# Patient Record
Sex: Male | Born: 1985 | Race: White | Hispanic: No | Marital: Single | Smoking: Current every day smoker
Health system: Southern US, Community
[De-identification: ages and names within clinical notes are randomized; demographics above are authoritative.]

## PROBLEM LIST (undated history)

## (undated) HISTORY — PX: OTHER SURGICAL HISTORY: SHX169

## (undated) HISTORY — PX: HERNIA REPAIR: SHX51

---
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
## 2007-01-18 ENCOUNTER — Emergency Department: Payer: Self-pay | Admitting: Emergency Medicine

## 2008-05-06 ENCOUNTER — Emergency Department: Payer: Self-pay | Admitting: Internal Medicine

## 2009-10-01 ENCOUNTER — Emergency Department: Payer: Self-pay | Admitting: Emergency Medicine

## 2010-07-23 ENCOUNTER — Emergency Department: Payer: Self-pay | Admitting: Emergency Medicine

## 2010-11-09 ENCOUNTER — Emergency Department: Payer: Self-pay | Admitting: Emergency Medicine

## 2011-02-01 ENCOUNTER — Emergency Department: Payer: Self-pay | Admitting: *Deleted

## 2011-05-30 ENCOUNTER — Emergency Department: Payer: Self-pay | Admitting: Internal Medicine

## 2011-11-09 ENCOUNTER — Emergency Department: Payer: Self-pay | Admitting: Emergency Medicine

## 2011-11-09 LAB — URINALYSIS, COMPLETE
Bacteria: NONE SEEN
Bilirubin,UR: NEGATIVE
Blood: NEGATIVE
Glucose,UR: NEGATIVE mg/dL (ref 0–75)
Protein: NEGATIVE
WBC UR: 2 /HPF (ref 0–5)

## 2011-11-09 LAB — COMPREHENSIVE METABOLIC PANEL
Anion Gap: 6 — ABNORMAL LOW (ref 7–16)
Bilirubin,Total: 0.5 mg/dL (ref 0.2–1.0)
Calcium, Total: 9 mg/dL (ref 8.5–10.1)
Co2: 26 mmol/L (ref 21–32)
Creatinine: 0.7 mg/dL (ref 0.60–1.30)
EGFR (African American): >60
EGFR (Non-African Amer.): >60
Glucose: 89 mg/dL (ref 65–99)
Potassium: 3.8 mmol/L (ref 3.5–5.1)
SGOT(AST): 16 U/L (ref 15–37)
Sodium: 138 mmol/L (ref 136–145)
Total Protein: 7.3 g/dL (ref 6.4–8.2)

## 2011-11-09 LAB — CBC
MCH: 30.2 pg (ref 26.0–34.0)
RDW: 14.3 % (ref 11.5–14.5)
WBC: 8 10*3/uL (ref 3.8–10.6)

## 2012-02-27 ENCOUNTER — Emergency Department: Payer: Self-pay | Admitting: Emergency Medicine

## 2012-02-27 LAB — CBC WITH DIFFERENTIAL/PLATELET
Eosinophil #: 0.3 10*3/uL (ref 0.0–0.7)
Eosinophil %: 4.6 %
HCT: 47.4 % (ref 40.0–52.0)
HGB: 16.5 g/dL (ref 13.0–18.0)
MCHC: 34.8 g/dL (ref 32.0–36.0)
MCV: 87 fL (ref 80–100)
Neutrophil #: 3.5 10*3/uL (ref 1.4–6.5)
Neutrophil %: 58.6 %
Platelet: 214 10*3/uL (ref 150–440)
RBC: 5.49 10*6/uL (ref 4.40–5.90)
RDW: 14 % (ref 11.5–14.5)
WBC: 6 10*3/uL (ref 3.8–10.6)

## 2012-02-27 LAB — BASIC METABOLIC PANEL
Anion Gap: 4 — ABNORMAL LOW (ref 7–16)
BUN: 9 mg/dL (ref 7–18)
Chloride: 106 mmol/L (ref 98–107)
Creatinine: 0.71 mg/dL (ref 0.60–1.30)
EGFR (African American): >60
EGFR (Non-African Amer.): >60
Potassium: 4 mmol/L (ref 3.5–5.1)

## 2012-10-18 ENCOUNTER — Emergency Department: Payer: Self-pay | Admitting: Unknown Physician Specialty

## 2012-11-10 ENCOUNTER — Emergency Department: Payer: Self-pay | Admitting: Emergency Medicine

## 2013-09-23 ENCOUNTER — Emergency Department: Payer: Self-pay | Admitting: Emergency Medicine

## 2014-04-29 ENCOUNTER — Emergency Department: Payer: Self-pay | Admitting: Student

## 2014-09-15 ENCOUNTER — Emergency Department
Admit: 2014-09-15 | Disposition: A | Discharge status: Home / Self Care | Payer: Self-pay | Admitting: Emergency Medicine

## 2015-06-15 ENCOUNTER — Encounter: Payer: Self-pay | Admitting: *Deleted

## 2015-06-15 ENCOUNTER — Emergency Department
Admission: EM | Admit: 2015-06-15 | Discharge: 2015-06-15 | Disposition: A | Discharge status: Home / Self Care | Payer: Self-pay | Attending: Emergency Medicine | Admitting: Emergency Medicine

## 2015-06-15 DIAGNOSIS — F17210 Nicotine dependence, cigarettes, uncomplicated: Secondary | ICD-10-CM | POA: Insufficient documentation

## 2015-06-15 DIAGNOSIS — H748X3 Other specified disorders of middle ear and mastoid, bilateral: Secondary | ICD-10-CM | POA: Insufficient documentation

## 2015-06-15 DIAGNOSIS — J209 Acute bronchitis, unspecified: Secondary | ICD-10-CM

## 2015-06-15 DIAGNOSIS — J45901 Unspecified asthma with (acute) exacerbation: Secondary | ICD-10-CM | POA: Insufficient documentation

## 2015-06-15 MED ORDER — PREDNISONE 20 MG PO TABS: 40.0000 mg | ORAL_TABLET | Freq: Every day | ORAL | Status: DC

## 2015-06-15 MED ORDER — ALBUTEROL SULFATE HFA 108 (90 BASE) MCG/ACT IN AERS: 2.0000 | INHALATION_SPRAY | Freq: Four times a day (QID) | RESPIRATORY_TRACT | Status: DC | PRN

## 2015-06-15 MED ORDER — AZITHROMYCIN 250 MG PO TABS: ORAL_TABLET | ORAL | Status: DC

## 2015-06-15 MED ORDER — PROMETHAZINE-DM 6.25-15 MG/5ML PO SYRP: 5.0000 mL | ORAL_SOLUTION | Freq: Four times a day (QID) | ORAL | Status: DC | PRN

## 2015-06-15 NOTE — Discharge Instructions (Signed)

## 2015-06-15 NOTE — ED Notes (Signed)
Pt complains of fever, cough, congestion and body aches for the last 5 days

## 2015-06-15 NOTE — ED Provider Notes (Signed)
CSN: [id number]     Arrival date & time 06/15/15  1748 History   First MD Initiated Contact with Patient 06/15/15 1952     Chief Complaint  Patient presents with  . URI     (Consider location/radiation/quality/duration/timing/severity/associated sxs/prior Treatment) HPI  30 year old male presents to emergency department for evaluation of cough congestion and wheezing. Symptoms have been present for approximately one week. He mostly complains of persistent cough that is dry, body aches, congestion. He denies any fevers. His had a few episodes of vomiting and diarrhea a few days ago. He is tolerating by mouth well. No abdominal pain chest pain or shortness of breath. She does have a history of asthma, is without inhalers, has had some intermittent wheezing.  History reviewed. No pertinent past medical history. History reviewed. No pertinent past surgical history. No family history on file. Social History  Substance Use Topics  . Smoking status: Current Every Day Smoker -- 1.00 packs/day    Types: Cigarettes  . Smokeless tobacco: None  . Alcohol Use: No    Review of Systems  Constitutional: Negative.  Negative for fever, chills, activity change and appetite change.  HENT: Positive for congestion, ear pain and rhinorrhea. Negative for mouth sores, sinus pressure, sore throat and trouble swallowing.   Eyes: Negative for photophobia, pain and discharge.  Respiratory: Positive for cough and wheezing (mild intermittent). Negative for chest tightness and shortness of breath.   Cardiovascular: Negative for chest pain and leg swelling.  Gastrointestinal: Negative for nausea, vomiting, abdominal pain, diarrhea and abdominal distention.  Genitourinary: Negative for dysuria and difficulty urinating.  Musculoskeletal: Negative for back pain, arthralgias and gait problem.  Skin: Negative for color change and rash.  Neurological: Negative for dizziness and headaches.  Hematological: Negative for  adenopathy.  Psychiatric/Behavioral: Negative for behavioral problems and agitation.      Allergies  Review of patient's allergies indicates no known allergies.  Home Medications   Prior to Admission medications   Medication Sig Start Date End Date Taking? Authorizing Provider  albuterol (PROVENTIL HFA;VENTOLIN HFA) 108 (90 Base) MCG/ACT inhaler Inhale 2 puffs into the lungs every 6 (six) hours as needed for wheezing or shortness of breath. 06/15/15   Duanne Guess, PA-C  azithromycin (ZITHROMAX Z-PAK) 250 MG tablet Take 2 tablets (500 mg) on  Day 1,  followed by 1 tablet (250 mg) once daily on Days 2 through 5. 06/15/15   Duanne Guess, PA-C  predniSONE (DELTASONE) 20 MG tablet Take 2 tablets (40 mg total) by mouth daily. 06/15/15   Duanne Guess, PA-C  promethazine-dextromethorphan (PROMETHAZINE-DM) 6.25-15 MG/5ML syrup Take 5 mLs by mouth 4 (four) times daily as needed for cough. 06/15/15   Duanne Guess, PA-C   BP 140/78 mmHg  Pulse 81  Temp(Src) 97.7 F (36.5 C) (Oral)  Resp 20  Ht 6' (1.829 m)  Wt 81.647 kg  BMI 24.41 kg/m2  SpO2 98% Physical Exam  Constitutional: He is oriented to person, place, and time. He appears well-developed and well-nourished.  HENT:  Head: Normocephalic and atraumatic.  Right Ear: Hearing, external ear and ear canal normal. Tympanic membrane is not erythematous. A middle ear effusion is present.  Left Ear: Hearing, external ear and ear canal normal. Tympanic membrane is not erythematous. A middle ear effusion is present.  Nose: Mucosal edema present.  Mouth/Throat: Uvula is midline. No trismus in the jaw. No dental abscesses. No oropharyngeal exudate, posterior oropharyngeal edema, posterior oropharyngeal erythema or tonsillar abscesses.  Eyes:  Conjunctivae and EOM are normal. Pupils are equal, round, and reactive to light.  Neck: Normal range of motion. Neck supple.  Cardiovascular: Normal rate, regular rhythm, normal heart sounds and intact  distal pulses.   Pulmonary/Chest: Effort normal and breath sounds normal. No respiratory distress. He has no wheezes. He has no rales. He exhibits no tenderness.  Abdominal: Soft. Bowel sounds are normal. He exhibits no distension. There is no tenderness.  Musculoskeletal: Normal range of motion. He exhibits no edema or tenderness.  Neurological: He is alert and oriented to person, place, and time.  Skin: Skin is warm and dry.  Psychiatric: He has a normal mood and affect. His behavior is normal. Judgment and thought content normal.    ED Course  Procedures (including critical care time) Labs Review Labs Reviewed - No data to display  Imaging Review No results found. I have personally reviewed and evaluated these images and lab results as part of my medical decision-making.   EKG Interpretation None      MDM   Final diagnoses:  Acute bronchitis, unspecified organism    30 year old male here with acute bronchitis. Afebrile, mild intermittent wheezing. No wheezing on exam. Vital signs are stable with no signs of respiratory distress. Patient is given azithromycin, prednisone 5 days, albuterol inhaler, dextromethorphan with promethazine. His educated on red flags to return to the ER for. Call for any worsening symptoms urgent changes in health.    Duanne Guess, PA-C 06/15/15 2007  Nena Polio, MD 06/16/15 [id number]

## 2015-06-15 NOTE — ED Notes (Signed)
Pt comes to ED w/ c/o flu like S/S.  Pt sts that it began 5 days ago.  Pt sts that he has taken OTC medication w/ no relief.

## 2015-09-08 ENCOUNTER — Encounter: Payer: Self-pay | Admitting: *Deleted

## 2015-09-08 ENCOUNTER — Emergency Department
Admission: EM | Admit: 2015-09-08 | Discharge: 2015-09-08 | Disposition: A | Discharge status: Home / Self Care | Payer: Self-pay | Attending: Emergency Medicine | Admitting: Emergency Medicine

## 2015-09-08 DIAGNOSIS — Z7952 Long term (current) use of systemic steroids: Secondary | ICD-10-CM | POA: Insufficient documentation

## 2015-09-08 DIAGNOSIS — Z79899 Other long term (current) drug therapy: Secondary | ICD-10-CM | POA: Insufficient documentation

## 2015-09-08 DIAGNOSIS — M546 Pain in thoracic spine: Secondary | ICD-10-CM

## 2015-09-08 DIAGNOSIS — Z792 Long term (current) use of antibiotics: Secondary | ICD-10-CM | POA: Insufficient documentation

## 2015-09-08 DIAGNOSIS — F17210 Nicotine dependence, cigarettes, uncomplicated: Secondary | ICD-10-CM | POA: Insufficient documentation

## 2015-09-08 DIAGNOSIS — M545 Low back pain: Secondary | ICD-10-CM | POA: Insufficient documentation

## 2015-09-08 MED ORDER — NAPROXEN 500 MG PO TBEC: 500.0000 mg | DELAYED_RELEASE_TABLET | Freq: Two times a day (BID) | ORAL | Status: DC

## 2015-09-08 MED ORDER — CYCLOBENZAPRINE HCL 5 MG PO TABS: 5.0000 mg | ORAL_TABLET | Freq: Three times a day (TID) | ORAL | Status: DC | PRN

## 2015-09-08 NOTE — ED Provider Notes (Signed)
[facility] Emergency Department Provider Note ____________________________________________  Time seen: 1135  I have reviewed the triage vital signs and the nursing notes.  HISTORY  Chief Complaint  Back Pain  HPI Joseph Beard is a 30 y.o. male reports pain to the midback after he was at home unloading his flat-bed truck. He felt a 'pop' to the midback unloading 10x2 lumbar at about 9 am. He reports  increased pain with movement and deep breaths. He denies chest pain, shortness or breath, or distal paresthesias. He rates his discomfort at 10/10 in triage.   History reviewed. No pertinent past medical history.  There are no active problems to display for this patient.  History reviewed. No pertinent past surgical history.  Current Outpatient Rx  Name  Route  Sig  Dispense  Refill  . albuterol (PROVENTIL HFA;VENTOLIN HFA) 108 (90 Base) MCG/ACT inhaler   Inhalation   Inhale 2 puffs into the lungs every 6 (six) hours as needed for wheezing or shortness of breath.   1 Inhaler   2   . azithromycin (ZITHROMAX Z-PAK) 250 MG tablet      Take 2 tablets (500 mg) on  Day 1,  followed by 1 tablet (250 mg) once daily on Days 2 through 5.   6 each   0   . cyclobenzaprine (FLEXERIL) 5 MG tablet   Oral   Take 1 tablet (5 mg total) by mouth every 8 (eight) hours as needed for muscle spasms.   12 tablet   0   . naproxen (EC NAPROSYN) 500 MG EC tablet   Oral   Take 1 tablet (500 mg total) by mouth 2 (two) times daily with a meal.   30 tablet   0   . predniSONE (DELTASONE) 20 MG tablet   Oral   Take 2 tablets (40 mg total) by mouth daily.   10 tablet   0   . promethazine-dextromethorphan (PROMETHAZINE-DM) 6.25-15 MG/5ML syrup   Oral   Take 5 mLs by mouth 4 (four) times daily as needed for cough.   60 mL   0    Allergies Vicodin  History reviewed. No pertinent family history.  Social History Social History  Substance Use Topics  . Smoking status:  Current Every Day Smoker -- 1.00 packs/day    Types: Cigarettes  . Smokeless tobacco: None  . Alcohol Use: No   Review of Systems  Constitutional: Negative for fever. Cardiovascular: Negative for chest pain. Respiratory: Negative for shortness of breath. Gastrointestinal: Negative for abdominal pain, vomiting and diarrhea. Genitourinary: Negative for dysuria. Musculoskeletal: Positive for back pain. Skin: Negative for rash. Neurological: Negative for headaches, focal weakness or numbness. ____________________________________________  PHYSICAL EXAM:  VITAL SIGNS: ED Triage Vitals  Enc Vitals Group     BP 09/08/15 1117 128/81 mmHg     Pulse Rate 09/08/15 1117 71     Resp 09/08/15 1117 20     Temp 09/08/15 1117 98 F (36.7 C)     Temp Source 09/08/15 1117 Oral     SpO2 09/08/15 1117 98 %     Weight 09/08/15 1117 186 lb (84.369 kg)     Height 09/08/15 1117 6' (1.829 m)     Head Cir --      Peak Flow --      Pain Score 09/08/15 1120 10     Pain Loc --      Pain Edu? --      Excl. in [place]? --  Constitutional: Alert and oriented. Well appearing and in no distress. Head: Normocephalic and atraumatic. Eyes: Conjunctivae are normal. PERRL. Normal extraocular movements Hematological/Lymphatic/Immunological: No cervical lymphadenopathy. Cardiovascular: Normal rate, regular rhythm.  Respiratory: Normal respiratory effort. No wheezes/rales/rhonchi. Gastrointestinal: Soft and nontender. No distention. No CVA tenderness Musculoskeletal:Normal spinal on it without midline tenderness, spasm, deformity, step-off. Patient is minimally tender to palpation to the paraspinal muscles of the thoracolumbar region left slightly greater than right. Nontender with normal range of motion in all extremities.  Neurologic: The nerves II through XII grossly intact. Normal intrinsic and opposition testing. Normal grip strength bilaterally. Normal UE and LE DTRs bilaterally. Normal gait without ataxia.  Normal speech and language. No gross focal neurologic deficits are appreciated. Skin:  Skin is warm, dry and intact. No rash noted. Psychiatric: Mood and affect are normal. Patient exhibits appropriate insight and judgment. ____________________________________________  INITIAL IMPRESSION / ASSESSMENT AND PLAN / ED COURSE  Patient with an acute thoracolumbar muscle strain secondary to mechanical injury. He is discharged with prescriptions for EC Naprosyn and Flexeril dose as directed. He is advised to apply ice to the sore muscles today and alternate ice and heat tomorrow. He is advised against dosing the muscle relaxant and continuing to do safety sensitive activities. He will follow up with prone to him in the healthcare for ongoing symptom management. ____________________________________________  FINAL CLINICAL IMPRESSION(S) / ED DIAGNOSES  Final diagnoses:  Thoracolumbar back pain      Melvenia Needles, PA-C 09/08/15 1214

## 2015-09-08 NOTE — ED Notes (Signed)
States he was unloading a truck while remodeling his house and heard a pop and now has mid to lower back pain, denies any hx of back pain

## 2015-09-08 NOTE — Discharge Instructions (Signed)
Back Pain, Adult Back pain is very common. The pain often gets better over time. The cause of back pain is usually not dangerous. Most people can learn to manage their back pain on their own.  HOME CARE  Watch your back pain for any changes. The following actions may help to lessen any pain you are feeling:  Stay active. Start with short walks on flat ground if you can. Try to walk farther each day.  Exercise regularly as told by your doctor. Exercise helps your back heal faster. It also helps avoid future injury by keeping your muscles strong and flexible.  Do not sit, drive, or stand in one place for more than 30 minutes.  Do not stay in bed. Resting more than 1-2 days can slow down your recovery.  Be careful when you bend or lift an object. Use good form when lifting:  Bend at your knees.  Keep the object close to your body.  Do not twist.  Sleep on a firm mattress. Lie on your side, and bend your knees. If you lie on your back, put a pillow under your knees.  Take medicines only as told by your doctor.  Put ice on the injured area.  Put ice in a plastic bag.  Place a towel between your skin and the bag.  Leave the ice on for 20 minutes, 2-3 times a day for the first 2-3 days. After that, you can switch between ice and heat packs.  Avoid feeling anxious or stressed. Find good ways to deal with stress, such as exercise.  Maintain a healthy weight. Extra weight puts stress on your back. GET HELP IF:   You have pain that does not go away with rest or medicine.  You have worsening pain that goes down into your legs or buttocks.  You have pain that does not get better in one week.  You have pain at night.  You lose weight.  You have a fever or chills. GET HELP RIGHT AWAY IF:   You cannot control when you poop (bowel movement) or pee (urinate).  Your arms or legs feel weak.  Your arms or legs lose feeling (numbness).  You feel sick to your stomach (nauseous) or  throw up (vomit).  You have belly (abdominal) pain.  You feel like you may pass out (faint).   This information is not intended to replace advice given to you by your health care provider. Make sure you discuss any questions you have with your health care provider.   Document Released: 11/15/2007 Document Revised: 06/19/2014 Document Reviewed: 09/30/2013 Elsevier Interactive Patient Education 2016 [employer]  Musculoskeletal Pain Musculoskeletal pain is muscle and boney aches and pains. These pains can occur in any part of the body. Your caregiver may treat you without knowing the cause of the pain. They may treat you if blood or urine tests, X-rays, and other tests were normal.  CAUSES There is often not a definite cause or reason for these pains. These pains may be caused by a type of germ (virus). The discomfort may also come from overuse. Overuse includes working out too hard when your body is not fit. Boney aches also come from weather changes. Bone is sensitive to atmospheric pressure changes. HOME CARE INSTRUCTIONS   Ask when your test results will be ready. Make sure you get your test results.  Only take over-the-counter or prescription medicines for pain, discomfort, or fever as directed by your caregiver. If you were given medications  for your condition, do not drive, operate machinery or power tools, or sign legal documents for 24 hours. Do not drink alcohol. Do not take sleeping pills or other medications that may interfere with treatment.  Continue all activities unless the activities cause more pain. When the pain lessens, slowly resume normal activities. Gradually increase the intensity and duration of the activities or exercise.  During periods of severe pain, bed rest may be helpful. Lay or sit in any position that is comfortable.  Putting ice on the injured area.  Put ice in a bag.  Place a towel between your skin and the bag.  Leave the ice on for 15 to 20  minutes, 3 to 4 times a day.  Follow up with your caregiver for continued problems and no reason can be found for the pain. If the pain becomes worse or does not go away, it may be necessary to repeat tests or do additional testing. Your caregiver may need to look further for a possible cause. SEEK IMMEDIATE MEDICAL CARE IF:  You have pain that is getting worse and is not relieved by medications.  You develop chest pain that is associated with shortness or breath, sweating, feeling sick to your stomach (nauseous), or throw up (vomit).  Your pain becomes localized to the abdomen.  You develop any new symptoms that seem different or that concern you. MAKE SURE YOU:   Understand these instructions.  Will watch your condition.  Will get help right away if you are not doing well or get worse.   This information is not intended to replace advice given to you by your health care provider. Make sure you discuss any questions you have with your health care provider.   Document Released: 05/29/2005 Document Revised: 08/21/2011 Document Reviewed: 01/31/2013 Elsevier Interactive Patient Education 2016 Elsevier Inc.  Back Pain, Adult Back pain is very common. The pain often gets better over time. The cause of back pain is usually not dangerous. Most people can learn to manage their back pain on their own.  HOME CARE  Watch your back pain for any changes. The following actions may help to lessen any pain you are feeling:  Stay active. Start with short walks on flat ground if you can. Try to walk farther each day.  Exercise regularly as told by your doctor. Exercise helps your back heal faster. It also helps avoid future injury by keeping your muscles strong and flexible.  Do not sit, drive, or stand in one place for more than 30 minutes.  Do not stay in bed. Resting more than 1-2 days can slow down your recovery.  Be careful when you bend or lift an object. Use good form when  lifting:  Bend at your knees.  Keep the object close to your body.  Do not twist.  Sleep on a firm mattress. Lie on your side, and bend your knees. If you lie on your back, put a pillow under your knees.  Take medicines only as told by your doctor.  Put ice on the injured area.  Put ice in a plastic bag.  Place a towel between your skin and the bag.  Leave the ice on for 20 minutes, 2-3 times a day for the first 2-3 days. After that, you can switch between ice and heat packs.  Avoid feeling anxious or stressed. Find good ways to deal with stress, such as exercise.  Maintain a healthy weight. Extra weight puts stress on your back. GET HELP  IF:   You have pain that does not go away with rest or medicine.  You have worsening pain that goes down into your legs or buttocks.  You have pain that does not get better in one week.  You have pain at night.  You lose weight.  You have a fever or chills. GET HELP RIGHT AWAY IF:   You cannot control when you poop (bowel movement) or pee (urinate).  Your arms or legs feel weak.  Your arms or legs lose feeling (numbness).  You feel sick to your stomach (nauseous) or throw up (vomit).  You have belly (abdominal) pain.  You feel like you may pass out (faint).   This information is not intended to replace advice given to you by your health care provider. Make sure you discuss any questions you have with your health care provider.   Document Released: 11/15/2007 Document Revised: 06/19/2014 Document Reviewed: 09/30/2013 Elsevier Interactive Patient Education [employer].  Your exam is normal today. Take the prescription meds as directed. Apply ice to the sore muscles today. Alternate ice with moist heat starting tomorrow. Follow-up with [facility] for continued symptoms.

## 2015-09-08 NOTE — ED Notes (Signed)
States he felt a pop in back while unloading a truck at home. State [place] is in mid to lower back

## 2015-09-10 ENCOUNTER — Ambulatory Visit
Admission: EM | Admit: 2015-09-10 | Discharge: 2015-09-10 | Discharge status: Absent without leave | Payer: Worker's Compensation

## 2016-09-24 ENCOUNTER — Emergency Department
Admission: EM | Admit: 2016-09-24 | Discharge: 2016-09-25 | Disposition: A | Discharge status: Home / Self Care | Payer: Self-pay | Attending: Emergency Medicine | Admitting: Emergency Medicine

## 2016-09-24 DIAGNOSIS — F17210 Nicotine dependence, cigarettes, uncomplicated: Secondary | ICD-10-CM | POA: Insufficient documentation

## 2016-09-24 DIAGNOSIS — Z79899 Other long term (current) drug therapy: Secondary | ICD-10-CM | POA: Insufficient documentation

## 2016-09-24 DIAGNOSIS — L0291 Cutaneous abscess, unspecified: Secondary | ICD-10-CM

## 2016-09-24 DIAGNOSIS — L03116 Cellulitis of left lower limb: Secondary | ICD-10-CM | POA: Insufficient documentation

## 2016-09-24 DIAGNOSIS — L02416 Cutaneous abscess of left lower limb: Secondary | ICD-10-CM | POA: Insufficient documentation

## 2016-09-24 NOTE — ED Triage Notes (Signed)
Pt states that he noticed an abscess to his left inner thigh a few days ago, pt states that it cont to get larger and is concerned that it may be infected

## 2016-09-25 ENCOUNTER — Encounter: Payer: Self-pay | Admitting: Emergency Medicine

## 2016-09-25 MED ORDER — OXYCODONE-ACETAMINOPHEN 5-325 MG PO TABS
1.0000 | ORAL_TABLET | Freq: Once | ORAL | Status: AC
  Administered 2016-09-25: 1 via ORAL
  Filled 2016-09-25: qty 1

## 2016-09-25 MED ORDER — CLINDAMYCIN HCL 300 MG PO CAPS: 300.0000 mg | ORAL_CAPSULE | Freq: Three times a day (TID) | ORAL | 0 refills | Status: DC

## 2016-09-25 MED ORDER — OXYCODONE-ACETAMINOPHEN 5-325 MG PO TABS: 1.0000 | ORAL_TABLET | ORAL | 0 refills | Status: DC | PRN

## 2016-09-25 MED ORDER — IBUPROFEN 800 MG PO TABS
800.0000 mg | ORAL_TABLET | Freq: Once | ORAL | Status: AC
  Administered 2016-09-25: 800 mg via ORAL
  Filled 2016-09-25: qty 1

## 2016-09-25 MED ORDER — CLINDAMYCIN HCL 150 MG PO CAPS
300.0000 mg | ORAL_CAPSULE | Freq: Once | ORAL | Status: AC
  Administered 2016-09-25: 300 mg via ORAL
  Filled 2016-09-25: qty 2

## 2016-09-25 MED ORDER — IBUPROFEN 800 MG PO TABS: 800.0000 mg | ORAL_TABLET | Freq: Three times a day (TID) | ORAL | 0 refills | Status: DC | PRN

## 2016-09-25 NOTE — ED Provider Notes (Signed)
[facility] Emergency Department Provider Note   ____________________________________________   First MD Initiated Contact with Patient 09/25/16 0022     (approximate)  I have reviewed the triage vital signs and the nursing notes.   HISTORY  Chief Complaint Abscess    HPI Joseph Beard is a 31 y.o. male who presents to the ED from home with a chief complaint of abscess. Patient reports a 4 day history of pain, redness and swelling to his left inner thigh. Denies drainage. Denies associated fever, chills, chest pain, shortness of breath, abdominal pain, nausea, vomiting. Denies recent travel or trauma. Nothing makes the symptoms better or worse.   Past medical history None  There are no active problems to display for this patient.   No past surgical history on file.  Prior to Admission medications   Medication Sig Start Date End Date Taking? Authorizing Provider  albuterol (PROVENTIL HFA;VENTOLIN HFA) 108 (90 Base) MCG/ACT inhaler Inhale 2 puffs into the lungs every 6 (six) hours as needed for wheezing or shortness of breath. 06/15/15   Duanne Guess, PA-C  azithromycin (ZITHROMAX Z-PAK) 250 MG tablet Take 2 tablets (500 mg) on  Day 1,  followed by 1 tablet (250 mg) once daily on Days 2 through 5. 06/15/15   Duanne Guess, PA-C  clindamycin (CLEOCIN) 300 MG capsule Take 1 capsule (300 mg total) by mouth 3 (three) times daily. 09/25/16   Paulette Blanch, MD  cyclobenzaprine (FLEXERIL) 5 MG tablet Take 1 tablet (5 mg total) by mouth every 8 (eight) hours as needed for muscle spasms. 09/08/15   Jenise V Bacon Menshew, PA-C  ibuprofen (ADVIL,MOTRIN) 800 MG tablet Take 1 tablet (800 mg total) by mouth every 8 (eight) hours as needed for moderate pain. 09/25/16   Paulette Blanch, MD  naproxen (EC NAPROSYN) 500 MG EC tablet Take 1 tablet (500 mg total) by mouth 2 (two) times daily with a meal. 09/08/15   Jenise V Bacon Menshew, PA-C  oxyCODONE-acetaminophen (ROXICET)  5-325 MG tablet Take 1 tablet by mouth every 4 (four) hours as needed for severe pain. 09/25/16   Paulette Blanch, MD  predniSONE (DELTASONE) 20 MG tablet Take 2 tablets (40 mg total) by mouth daily. 06/15/15   Duanne Guess, PA-C  promethazine-dextromethorphan (PROMETHAZINE-DM) 6.25-15 MG/5ML syrup Take 5 mLs by mouth 4 (four) times daily as needed for cough. 06/15/15   Duanne Guess, PA-C    Allergies Vicodin [hydrocodone-acetaminophen]  No family history on file.  Social History Social History  Substance Use Topics  . Smoking status: Current Every Day Smoker    Packs/day: 1.00    Types: Cigarettes  . Smokeless tobacco: Not on file  . Alcohol use No    Review of Systems  Constitutional: No fever/chills. Eyes: No visual changes. ENT: No sore throat. Cardiovascular: Denies chest pain. Respiratory: Denies shortness of breath. Gastrointestinal: No abdominal pain.  No nausea, no vomiting.  No diarrhea.  No constipation. Genitourinary: Negative for dysuria. Musculoskeletal: Negative for back pain. Skin: Positive for abscess. Negative for rash. Neurological: Negative for headaches, focal weakness or numbness.  10-point ROS otherwise negative.  ____________________________________________   PHYSICAL EXAM:  VITAL SIGNS: ED Triage Vitals  Enc Vitals Group     BP 09/24/16 2228 (!) 148/69     Pulse Rate 09/24/16 2228 83     Resp 09/24/16 2228 16     Temp 09/24/16 2228 98.1 F (36.7 C)     Temp  Source 09/24/16 2228 Oral     SpO2 09/24/16 2228 98 %     Weight 09/24/16 2228 202 lb (91.6 kg)     Height 09/24/16 2228 5\' 11"  (1.803 m)     Head Circumference --      Peak Flow --      Pain Score 09/24/16 2232 10     Pain Loc --      Pain Edu? --      Excl. in [place]? --     Constitutional: Alert and oriented. Well appearing and in no acute distress. Eyes: Conjunctivae are normal. PERRL. EOMI. Head: Atraumatic. Nose: No congestion/rhinnorhea. Mouth/Throat: Mucous membranes are  moist.  Oropharynx non-erythematous. Neck: No stridor.   Cardiovascular: Normal rate, regular rhythm. Grossly normal heart sounds.  Good peripheral circulation. Respiratory: Normal respiratory effort.  No retractions. Lungs CTAB. Gastrointestinal: Soft and nontender. No distention. No abdominal bruits. No CVA tenderness. Musculoskeletal: No lower extremity tenderness nor edema.  No joint effusions. Neurologic:  Normal speech and language. No gross focal neurologic deficits are appreciated. No gait instability. Skin:  2 cm area of warmth, erythema and induration to left inner thigh. There is no fluctuance. There is no streaking. Psychiatric: Mood and affect are normal. Speech and behavior are normal.  ____________________________________________   LABS (all labs ordered are listed, but only abnormal results are displayed)  Labs Reviewed - No data to display ____________________________________________  EKG  None ____________________________________________  RADIOLOGY  None ____________________________________________   PROCEDURES  Procedure(s) performed: None  Procedures  Critical Care performed: No  ____________________________________________   INITIAL IMPRESSION / ASSESSMENT AND PLAN / ED COURSE  Pertinent labs & imaging results that were available during my care of the patient were reviewed by me and considered in my medical decision making (see chart for details).  31 year old male who presents with cellulitis and developing abscess. Area is not amenable to incision & drainage at this time. Patient will either go to acute care clinic or return to the ED in 2 days for recheck. At that time he may require I&D. Strict return precautions given. Patient verbalizes understanding and agrees with plan of care.      ____________________________________________   FINAL CLINICAL IMPRESSION(S) / ED DIAGNOSES  Final diagnoses:  Cellulitis of left lower extremity    Abscess      NEW MEDICATIONS STARTED DURING THIS VISIT:  New Prescriptions   CLINDAMYCIN (CLEOCIN) 300 MG CAPSULE    Take 1 capsule (300 mg total) by mouth 3 (three) times daily.   IBUPROFEN (ADVIL,MOTRIN) 800 MG TABLET    Take 1 tablet (800 mg total) by mouth every 8 (eight) hours as needed for moderate pain.   OXYCODONE-ACETAMINOPHEN (ROXICET) 5-325 MG TABLET    Take 1 tablet by mouth every 4 (four) hours as needed for severe pain.     Note:  This document was prepared using Dragon voice recognition software and may include unintentional dictation errors.    Paulette Blanch, MD 09/25/16 [phone]

## 2016-09-25 NOTE — ED Notes (Signed)
Pt presents with abscess high on left inner thigh; noticed area about 4 days ago; pain and swelling has increased; no drainage from site; denies fever;

## 2016-09-25 NOTE — Discharge Instructions (Signed)
The area on your thigh is not ready to be lanced tonight. Please start antibiotic (clindamycin 300 mg 3 times daily) as prescribed. You may take Motrin and Percocet as needed for pain. Apply warm compress to affected area several times daily to draw to a head. Go to the acute care clinic or return to the ER in 2 days for recheck. Return to the ER sooner for worsening symptoms, fever, vomiting or other concerns.

## 2017-05-31 ENCOUNTER — Encounter: Payer: Self-pay | Admitting: Emergency Medicine

## 2017-05-31 ENCOUNTER — Emergency Department: Payer: Self-pay

## 2017-05-31 ENCOUNTER — Other Ambulatory Visit: Payer: Self-pay

## 2017-05-31 ENCOUNTER — Emergency Department
Admission: EM | Admit: 2017-05-31 | Discharge: 2017-05-31 | Disposition: A | Discharge status: Home / Self Care | Payer: Self-pay | Attending: Emergency Medicine | Admitting: Emergency Medicine

## 2017-05-31 DIAGNOSIS — F17210 Nicotine dependence, cigarettes, uncomplicated: Secondary | ICD-10-CM | POA: Insufficient documentation

## 2017-05-31 DIAGNOSIS — Z79899 Other long term (current) drug therapy: Secondary | ICD-10-CM | POA: Insufficient documentation

## 2017-05-31 DIAGNOSIS — M62830 Muscle spasm of back: Secondary | ICD-10-CM | POA: Insufficient documentation

## 2017-05-31 MED ORDER — MELOXICAM 15 MG PO TABS: 15.0000 mg | ORAL_TABLET | Freq: Every day | ORAL | 2 refills | Status: AC

## 2017-05-31 NOTE — ED Provider Notes (Signed)
[facility] Emergency Department Provider Note  ____________________________________________   First MD Initiated Contact with Patient 05/31/17 1400     (approximate)  I have reviewed the triage vital signs and the nursing notes.   HISTORY  Chief Complaint Back Pain    HPI Joseph Beard is a 31 y.o. 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 No pertinent past medical history.  There are no active problems to display for this patient.   History reviewed. No pertinent surgical history.  Prior to Admission medications   Medication Sig Start Date End Date Taking? Authorizing Provider  albuterol (PROVENTIL HFA;VENTOLIN HFA) 108 (90 Base) MCG/ACT inhaler Inhale 2 puffs into the lungs every 6 (six) hours as needed for wheezing or shortness of breath. 06/15/15   Duanne Guess, PA-C  clindamycin (CLEOCIN) 300 MG capsule Take 1 capsule (300 mg total) by mouth 3 (three) times daily. 09/25/16   Paulette Blanch, MD  ibuprofen (ADVIL,MOTRIN) 800 MG tablet Take 1 tablet (800 mg total) by mouth every 8 (eight) hours as needed for moderate pain. 09/25/16   Paulette Blanch, MD  oxyCODONE-acetaminophen (ROXICET) 5-325 MG tablet Take 1 tablet by mouth every 4 (four) hours as needed for severe pain. 09/25/16   Paulette Blanch, MD    Allergies Vicodin [hydrocodone-acetaminophen]  No family history on file.  Social History Social History   Tobacco Use  . Smoking status: Current Every Day Smoker    Packs/day:  1.00    Types: Cigarettes  . Smokeless tobacco: Former Network engineer Use Topics  . Alcohol use: No  . Drug use: No    Review of Systems  Constitutional: No fever/chills Eyes: No visual changes. ENT: No sore throat. Respiratory: Denies cough Genitourinary: Negative for dysuria. Musculoskeletal: Positive for back pain and neck pain  Skin: Negative for rash.    ____________________________________________   PHYSICAL EXAM:  VITAL SIGNS: ED Triage Vitals [05/31/17 1352]  Enc Vitals Group     BP (!) 144/74     Pulse Rate 68     Resp 18     Temp 98.2 F (36.8 C)     Temp Source Oral     SpO2 99 %     Weight 190 lb (86.2 kg)     Height 6' (1.829 m)     Head Circumference      Peak Flow      Pain Score 10     Pain Loc      Pain Edu?      Excl. in [place]?     Constitutional: Alert and oriented. Well appearing and in no acute distress. Eyes: Conjunctivae are normal.  Head: Atraumatic. Nose: No congestion/rhinnorhea. Mouth/Throat: Mucous membranes are moist.   Cardiovascular: Normal rate, regular rhythm.  Heart sounds are normal Respiratory: Normal respiratory effort.  No retractions, lungs are clear to auscultation GU: deferred Musculoskeletal: FROM all extremities, warm and well perfused, ribs  are not tender at this time, mid thoracic area and lower cervical spine area are both tender, patient has full range of motion is able to ambulate without any difficulty, neurovascular is intact Neurologic:  Normal speech and language.  Skin:  Skin is warm, dry and intact. No rash noted.  No bruising is noted at this time, however he has a picture on his phone of his ribs with a fair amount of bruising Psychiatric: Mood and affect are normal. Speech and behavior are normal.  ____________________________________________   LABS (all labs ordered are listed, but only abnormal results are displayed)  Labs Reviewed - No data to  display ____________________________________________   ____________________________________________  RADIOLOGY  X-ray of the C-spine and thoracic spine are negative  ____________________________________________   PROCEDURES  Procedure(s) performed: No      ____________________________________________   INITIAL IMPRESSION / ASSESSMENT AND PLAN / ED COURSE  Pertinent labs & imaging results that were available during my care of the patient were reviewed by me and considered in my medical decision making (see chart for details).  Patient is a 31 year old male who fell 30 feet about a month ago but landed in a bush, he is continued to have pain, on physical exam he appears well, he is a little tender in the thoracic and cervical spine, x-rays are ordered of the C-spine and thoracic spine, he is stable at this time    ----------------------------------------- 3:38 PM on 05/31/2017 -----------------------------------------  X-ray of the C-spine and T-spine are negative, patient is discharged in stable condition he was instructed to follow-up with either his doctor or the acute care if he is worsening, prescription for meloxicam 15 mg daily was given, he is to apply ice to any area that hurts, he may return to the emergency department if he is worsening   ____________________________________________   FINAL CLINICAL IMPRESSION(S) / ED DIAGNOSES  Final diagnoses:  None      NEW MEDICATIONS STARTED DURING THIS VISIT:  This SmartLink is deprecated. Use AVSMEDLIST instead to display the medication list for a patient.   Note:  This document was prepared using Dragon voice recognition software and may include unintentional dictation errors.    Versie Starks, PA-C 05/31/17 1538    Nance Pear, MD 05/31/17 [phone]

## 2017-05-31 NOTE — Discharge Instructions (Signed)
FOLLOW UP WITH YOUR REGULAR DOCTOR IF NOT BETTER IN 5-7 DAYS OR GO TO THE ACUTE CARE

## 2017-05-31 NOTE — ED Notes (Signed)
See triage note  States he fell approx 30 ft from a roof about 7-8 weeks ago  After the fall he developed some pain to lateral rib area ..states he thinks he may have broken some ribs  Now having pain to entire back  Pain increases with movement

## 2017-05-31 NOTE — ED Triage Notes (Addendum)
Pt reports back pain that starts at base of neck and shoots down. Pt reports pain increases when turns his head to the right. Pt reports fell 30 feet from a roof two months ago but did not seek treatment. Pt reports pain from that injury is no longer present but this pain started 2-3 days ago. Pt ambulatory to triage. No apparent distress noted. No protocols at this time per Dr. Corky Downs.

## 2017-08-29 ENCOUNTER — Encounter: Payer: Self-pay | Admitting: Emergency Medicine

## 2017-08-29 ENCOUNTER — Emergency Department
Admission: EM | Admit: 2017-08-29 | Discharge: 2017-08-29 | Disposition: A | Discharge status: Home / Self Care | Payer: Self-pay | Attending: Emergency Medicine | Admitting: Emergency Medicine

## 2017-08-29 DIAGNOSIS — A084 Viral intestinal infection, unspecified: Secondary | ICD-10-CM | POA: Insufficient documentation

## 2017-08-29 DIAGNOSIS — F17210 Nicotine dependence, cigarettes, uncomplicated: Secondary | ICD-10-CM | POA: Insufficient documentation

## 2017-08-29 DIAGNOSIS — Z79899 Other long term (current) drug therapy: Secondary | ICD-10-CM | POA: Insufficient documentation

## 2017-08-29 LAB — INFLUENZA PANEL BY PCR (TYPE A & B)
INFLAPCR: NEGATIVE
INFLBPCR: NEGATIVE

## 2017-08-29 MED ORDER — KETOROLAC TROMETHAMINE 30 MG/ML IJ SOLN
30.0000 mg | Freq: Once | INTRAMUSCULAR | Status: AC
  Administered 2017-08-29: 30 mg via INTRAVENOUS
  Filled 2017-08-29: qty 1

## 2017-08-29 MED ORDER — ONDANSETRON HCL 4 MG/2ML IJ SOLN
4.0000 mg | Freq: Once | INTRAMUSCULAR | Status: AC
  Administered 2017-08-29: 4 mg via INTRAVENOUS
  Filled 2017-08-29: qty 2

## 2017-08-29 MED ORDER — LOPERAMIDE HCL 2 MG PO TABS: 2.0000 mg | ORAL_TABLET | Freq: Four times a day (QID) | ORAL | 0 refills | Status: DC | PRN

## 2017-08-29 MED ORDER — BENZONATATE 100 MG PO CAPS: 200.0000 mg | ORAL_CAPSULE | Freq: Three times a day (TID) | ORAL | 0 refills | Status: DC | PRN

## 2017-08-29 MED ORDER — PROMETHAZINE HCL 25 MG RE SUPP: 25.0000 mg | Freq: Four times a day (QID) | RECTAL | 1 refills | Status: DC | PRN

## 2017-08-29 MED ORDER — IBUPROFEN 600 MG PO TABS: 600.0000 mg | ORAL_TABLET | Freq: Three times a day (TID) | ORAL | 0 refills | Status: DC | PRN

## 2017-08-29 MED ORDER — SODIUM CHLORIDE 0.9 % IV BOLUS (SEPSIS)
1000.0000 mL | Freq: Once | INTRAVENOUS | Status: AC
  Administered 2017-08-29: 1000 mL via INTRAVENOUS

## 2017-08-29 NOTE — ED Notes (Signed)
Stool sample sent to lab

## 2017-08-29 NOTE — ED Triage Notes (Signed)
Pt with flu like sx. Mask placed on pt.

## 2017-08-29 NOTE — ED Provider Notes (Signed)
[facility] Emergency Department Provider Note   ____________________________________________   First MD Initiated Contact with Patient 08/29/17 1223     (approximate)  I have reviewed the triage vital signs and the nursing notes.   HISTORY  Chief Complaint Flu Like Sx    HPI Joseph Beard is a 32 y.o. male patient complained of generalized body aches, sore throat, nausea, vomiting, diarrhea.  Onset of complaint 2 days.  Patient state no relief with over-the-counter cold preparations.  Patient rates his pain as a 5/10.  Patient described the pain is "ache".  Patient not taking flu shot for this season.  History reviewed. No pertinent past medical history.  There are no active problems to display for this patient.   History reviewed. No pertinent surgical history.  Prior to Admission medications   Medication Sig Start Date End Date Taking? Authorizing Provider  benzonatate (TESSALON PERLES) 100 MG capsule Take 2 capsules (200 mg total) by mouth 3 (three) times daily as needed for cough. 08/29/17 08/29/18  Sable Feil, PA-C  ibuprofen (ADVIL,MOTRIN) 600 MG tablet Take 1 tablet (600 mg total) by mouth every 8 (eight) hours as needed. 08/29/17   Sable Feil, PA-C  loperamide (IMODIUM A-D) 2 MG tablet Take 1 tablet (2 mg total) by mouth 4 (four) times daily as needed for diarrhea or loose stools. 08/29/17   Sable Feil, PA-C  meloxicam (MOBIC) 15 MG tablet Take 1 tablet (15 mg total) by mouth daily. 05/31/17 05/31/18  Fisher, Linden Dolin, PA-C  promethazine (PHENERGAN) 25 MG suppository Place 1 suppository (25 mg total) rectally every 6 (six) hours as needed for nausea. 08/29/17 08/29/18  Sable Feil, PA-C    Allergies Vicodin [hydrocodone-acetaminophen]  No family history on file.  Social History Social History   Tobacco Use  . Smoking status: Current Every Day Smoker    Packs/day: 1.00    Types: Cigarettes  . Smokeless tobacco: Former  Network engineer Use Topics  . Alcohol use: No  . Drug use: No    Review of Systems Constitutional: No fever/chills.  Generalized body aches. Eyes: No visual changes. ENT: Sore throat Cardiovascular: Denies chest pain. Respiratory: Denies shortness of breath. Gastrointestinal: No abdominal pain.  Nausea, vomiting, and diarrhea.  No constipation. Genitourinary: Negative for dysuria. Musculoskeletal: Negative for back pain. Skin: Negative for rash. Neurological: Negative for headaches, focal weakness or numbness.   ____________________________________________   PHYSICAL EXAM:  VITAL SIGNS: ED Triage Vitals  Enc Vitals Group     BP 08/29/17 1213 125/82     Pulse Rate 08/29/17 1213 95     Resp 08/29/17 1213 16     Temp 08/29/17 1213 97.9 F (36.6 C)     Temp Source 08/29/17 1213 Oral     SpO2 08/29/17 1213 97 %     Weight 08/29/17 1202 190 lb (86.2 kg)     Height 08/29/17 1213 6' (1.829 m)     Head Circumference --      Peak Flow --      Pain Score 08/29/17 1202 5     Pain Loc --      Pain Edu? --      Excl. in [place]? --    Constitutional: Alert and oriented.  Appears malaise.  Actively vomiting.  Just returned from bathroom secondary to loose stool. Nose: Edematous nasal turbinates clear rhinorrhea. Mouth/Throat: Mucous membranes are dry.  Oropharynx non-erythematous. Neck: No stridor.  No cervical spine tenderness  to palpation. Hematological/Lymphatic/Immunilogical: No cervical lymphadenopathy. Cardiovascular: Normal rate, regular rhythm. Grossly normal heart sounds.  Good peripheral circulation. Respiratory: Normal respiratory effort.  No retractions. Lungs CTAB. Gastrointestinal: Hyperactive bowel sounds.  Soft and nontender. No distention. No abdominal bruits. No CVA tenderness. Skin:  Skin is warm, dry and intact. No rash noted. Psychiatric: Mood and affect are normal. Speech and behavior are normal.  ____________________________________________   LABS (all labs  ordered are listed, but only abnormal results are displayed)  Labs Reviewed  INFLUENZA PANEL BY PCR (TYPE A & B)   ____________________________________________  EKG   ____________________________________________  RADIOLOGY  ED MD interpretation:    Official radiology report(s): No results found.  ____________________________________________   PROCEDURES  Procedure(s) performed: None  Procedures  Critical Care performed: No  ____________________________________________   INITIAL IMPRESSION / ASSESSMENT AND PLAN / ED COURSE  As part of my medical decision making, I reviewed the following data within the [place]    Patient presents with upper respiratory symptoms and nausea, vomiting, and diarrhea.  Onset of complaints for 2 days.  Patient was negative for influenza.  Patient physical exam consistent with viral gastroenteritis.  Patient was rehydrated with normal saline and given IV Zofran.  Patient given discharge care instructions and a work note.  Patient [facility] in [facility] days.  Return back to ED if condition worsens.      ____________________________________________   FINAL CLINICAL IMPRESSION(S) / ED DIAGNOSES  Final diagnoses:  Viral gastroenteritis     ED Discharge Orders        Ordered    loperamide (IMODIUM A-D) 2 MG tablet  4 times daily PRN     08/29/17 1357    promethazine (PHENERGAN) 25 MG suppository  Every 6 hours PRN     08/29/17 1357    benzonatate (TESSALON PERLES) 100 MG capsule  3 times daily PRN     08/29/17 1357    ibuprofen (ADVIL,MOTRIN) 600 MG tablet  Every 8 hours PRN     08/29/17 1357       Note:  This document was prepared using Dragon voice recognition software and may include unintentional dictation errors.    Sable Feil, PA-C 08/29/17 1402    Earleen Newport, MD 08/29/17 1452

## 2018-04-22 ENCOUNTER — Encounter: Payer: Self-pay | Admitting: Emergency Medicine

## 2018-04-22 ENCOUNTER — Other Ambulatory Visit: Payer: Self-pay

## 2018-04-22 ENCOUNTER — Emergency Department
Admission: EM | Admit: 2018-04-22 | Discharge: 2018-04-22 | Disposition: A | Discharge status: Home / Self Care | Payer: Self-pay | Attending: Emergency Medicine | Admitting: Emergency Medicine

## 2018-04-22 DIAGNOSIS — J069 Acute upper respiratory infection, unspecified: Secondary | ICD-10-CM

## 2018-04-22 DIAGNOSIS — D1722 Benign lipomatous neoplasm of skin and subcutaneous tissue of left arm: Secondary | ICD-10-CM

## 2018-04-22 DIAGNOSIS — F17210 Nicotine dependence, cigarettes, uncomplicated: Secondary | ICD-10-CM | POA: Insufficient documentation

## 2018-04-22 DIAGNOSIS — B9789 Other viral agents as the cause of diseases classified elsewhere: Secondary | ICD-10-CM | POA: Insufficient documentation

## 2018-04-22 MED ORDER — IBUPROFEN 600 MG PO TABS: 600.0000 mg | ORAL_TABLET | Freq: Three times a day (TID) | ORAL | 0 refills | Status: DC | PRN

## 2018-04-22 MED ORDER — PROMETHAZINE-DM 6.25-15 MG/5ML PO SYRP: 5.0000 mL | ORAL_SOLUTION | Freq: Four times a day (QID) | ORAL | 0 refills | Status: DC | PRN

## 2018-04-22 MED ORDER — ONDANSETRON 8 MG PO TBDP
8.0000 mg | ORAL_TABLET | Freq: Once | ORAL | Status: AC
  Administered 2018-04-22: 8 mg via ORAL
  Filled 2018-04-22: qty 1

## 2018-04-22 NOTE — ED Triage Notes (Signed)
Woke up and vomited.  Then he feels nasally congested since.  Maybe fever.  Says he did not go to work, so needs anote.

## 2018-04-22 NOTE — ED Notes (Signed)
See triage note  Presents with nasal congestion and cough   sxs' started couple of days ago  States he did have some vomiting d/t cough this am   Also has a swollen area to left upper and flank area  Denies any injury

## 2018-04-22 NOTE — ED Provider Notes (Signed)
[facility] Emergency Department Provider Note   ____________________________________________   First MD Initiated Contact with Patient 04/22/18 1249     (approximate)  I have reviewed the triage vital signs and the nursing notes.   HISTORY  Chief Complaint Nasal Congestion; Emesis; and Cough    HPI Joseph Beard is a 32 y.o. male patient complain of  of nasal congestion, cough and vomiting with a.m. awakening.  Patient denies fever.  Patient denies dyspnea or chest pain.  Patient had body aches.  Patient also voices concern secondary to nodule lesion posterior left upper arm and left lateral back.  Patient states the lesion is nonpainful.  No palliative measures for complaint.   History reviewed. No pertinent past medical history.  There are no active problems to display for this patient.   Past Surgical History:  Procedure Laterality Date  . club foot surgery      Prior to Admission medications   Medication Sig Start Date End Date Taking? Authorizing Provider  benzonatate (TESSALON PERLES) 100 MG capsule Take 2 capsules (200 mg total) by mouth 3 (three) times daily as needed for cough. 08/29/17 08/29/18  Sable Feil, PA-C  ibuprofen (ADVIL,MOTRIN) 600 MG tablet Take 1 tablet (600 mg total) by mouth every 8 (eight) hours as needed. 08/29/17   Sable Feil, PA-C  ibuprofen (ADVIL,MOTRIN) 600 MG tablet Take 1 tablet (600 mg total) by mouth every 8 (eight) hours as needed. 04/22/18   Sable Feil, PA-C  loperamide (IMODIUM A-D) 2 MG tablet Take 1 tablet (2 mg total) by mouth 4 (four) times daily as needed for diarrhea or loose stools. 08/29/17   Sable Feil, PA-C  meloxicam (MOBIC) 15 MG tablet Take 1 tablet (15 mg total) by mouth daily. 05/31/17 05/31/18  Fisher, Linden Dolin, PA-C  promethazine (PHENERGAN) 25 MG suppository Place 1 suppository (25 mg total) rectally every 6 (six) hours as needed for nausea. 08/29/17 08/29/18  Sable Feil, PA-C    promethazine-dextromethorphan (PROMETHAZINE-DM) 6.25-15 MG/5ML syrup Take 5 mLs by mouth 4 (four) times daily as needed for cough. 04/22/18   Sable Feil, PA-C    Allergies Tramadol and Vicodin [hydrocodone-acetaminophen]  No family history on file.  Social History Social History   Tobacco Use  . Smoking status: Current Every Day Smoker    Packs/day: 1.00    Types: Cigarettes  . Smokeless tobacco: Former Network engineer Use Topics  . Alcohol use: No  . Drug use: No    Review of Systems Constitutional: No fever/chills Eyes: No visual changes. ENT: No sore throat. Cardiovascular: Denies chest pain. Respiratory: Denies shortness of breath.  Nonproductive cough Gastrointestinal: No abdominal pain.  No nausea, vomiting secondary to cough.  No diarrhea.  No constipation. Genitourinary: Negative for dysuria. Musculoskeletal: Negative for back pain. Skin: Negative for rash.  Nodule lesions on left upper extremity and left lateral back. Neurological: Negative for headaches, focal weakness or numbness. Allergic/Immunilogical: Tramadol and Vicodin. ____________________________________________   PHYSICAL EXAM:  VITAL SIGNS: ED Triage Vitals  Enc Vitals Group     BP 04/22/18 1213 119/73     Pulse Rate 04/22/18 1213 74     Resp 04/22/18 1213 20     Temp 04/22/18 1213 98.1 F (36.7 C)     Temp Source 04/22/18 1213 Oral     SpO2 04/22/18 1213 98 %     Weight 04/22/18 1209 192 lb (87.1 kg)     Height 04/22/18 1209  5\' 11"  (1.803 m)     Head Circumference --      Peak Flow --      Pain Score 04/22/18 1209 6     Pain Loc --      Pain Edu? --      Excl. in [place]? --    Constitutional: Alert and oriented. Well appearing and in no acute distress. Nose: Edematous nasal turbinates Mouth/Throat: Mucous membranes are moist.  Oropharynx non-erythematous.  Postnasal drainage. Neck: No stridor.  Cardiovascular: Normal rate, regular rhythm. Grossly normal heart sounds.  Good  peripheral circulation. Respiratory: Normal respiratory effort.  No retractions. Lungs CTAB. Neurologic:  Normal speech and language. No gross focal neurologic deficits are appreciated. No gait instability. Skin:  Skin is warm, dry and intact. No rash noted. Psychiatric: Mood and affect are normal. Speech and behavior are normal.  ____________________________________________   LABS (all labs ordered are listed, but only abnormal results are displayed)  Labs Reviewed - No data to display ____________________________________________  EKG   ____________________________________________  RADIOLOGY  ED MD interpretation:    Official radiology report(s): No results found.  ____________________________________________   PROCEDURES  Procedure(s) performed: None  Procedures  Critical Care performed: No  ____________________________________________   INITIAL IMPRESSION / ASSESSMENT AND PLAN / ED COURSE  As part of my medical decision making, I reviewed the following data within the [place]    Patient presents with cough and vomiting consistent with viral illness.  Patient also has nausea lesions are consistent with lipoma.  Patient given discharge care instruction advised take medication as directed.  Patient given a work note and [facility].      ____________________________________________   FINAL CLINICAL IMPRESSION(S) / ED DIAGNOSES  Final diagnoses:  Viral URI with cough  Lipoma of left upper extremity     ED Discharge Orders         Ordered    promethazine-dextromethorphan (PROMETHAZINE-DM) 6.25-15 MG/5ML syrup  4 times daily PRN     04/22/18 1259    ibuprofen (ADVIL,MOTRIN) 600 MG tablet  Every 8 hours PRN     04/22/18 1259           Note:  This document was prepared using Dragon voice recognition software and may include unintentional dictation errors.    Sable Feil, PA-C 04/22/18 1306    Earleen Newport, MD 04/23/18 [phone]

## 2018-07-02 ENCOUNTER — Other Ambulatory Visit: Payer: Self-pay

## 2018-07-02 ENCOUNTER — Encounter: Payer: Self-pay | Admitting: Emergency Medicine

## 2018-07-02 ENCOUNTER — Emergency Department
Admission: EM | Admit: 2018-07-02 | Discharge: 2018-07-02 | Disposition: A | Discharge status: Home / Self Care | Payer: Self-pay | Attending: Student in an Organized Health Care Education/Training Program | Admitting: Student in an Organized Health Care Education/Training Program

## 2018-07-02 DIAGNOSIS — J039 Acute tonsillitis, unspecified: Secondary | ICD-10-CM | POA: Insufficient documentation

## 2018-07-02 DIAGNOSIS — F17210 Nicotine dependence, cigarettes, uncomplicated: Secondary | ICD-10-CM | POA: Insufficient documentation

## 2018-07-02 LAB — GROUP A STREP BY PCR: GROUP A STREP BY PCR: NOT DETECTED

## 2018-07-02 MED ORDER — AMOXICILLIN 500 MG PO CAPS: 500.0000 mg | ORAL_CAPSULE | Freq: Three times a day (TID) | ORAL | 0 refills | Status: DC

## 2018-07-02 NOTE — Discharge Instructions (Addendum)
[facility] any continued problems.  Begin taking amoxicillin 500 mg 3 times daily for the entire 10 days.  You may take Tylenol as needed for pain.  Increase fluids.

## 2018-07-02 NOTE — ED Provider Notes (Signed)
[facility] Emergency Department Provider Note   ____________________________________________   First MD Initiated Contact with Patient 07/02/18 1243     (approximate)  I have reviewed the triage vital signs and the nursing notes.   HISTORY  Chief Complaint Sore Throat   HPI Joseph Beard is a 33 y.o. male presents to the ED with concerns of strep throat.  Patient states his wife had strep throat last week.  He now believes that he also has strep throat as he is having a lot of pain and a "coating on his tongue".  He began having symptoms 3 days ago and increased pain with swallowing.  He is uncertain aware of any fever.  Rates his pain as a 10/10.  History reviewed. No pertinent past medical history.  There are no active problems to display for this patient.   Past Surgical History:  Procedure Laterality Date  . club foot surgery      Prior to Admission medications   Medication Sig Start Date End Date Taking? Authorizing Provider  amoxicillin (AMOXIL) 500 MG capsule Take 1 capsule (500 mg total) by mouth 3 (three) times daily. 07/02/18   Johnn Hai, PA-C    Allergies Tramadol and Vicodin [hydrocodone-acetaminophen]  No family history on file.  Social History Social History   Tobacco Use  . Smoking status: Current Every Day Smoker    Packs/day: 1.00    Types: Cigarettes  . Smokeless tobacco: Former Network engineer Use Topics  . Alcohol use: No  . Drug use: No    Review of Systems Constitutional: No fever/chills Eyes: No visual changes. ENT: Positive sore throat. Cardiovascular: Denies chest pain. Respiratory: Denies shortness of breath. Gastrointestinal: No abdominal pain.  No nausea, no vomiting.  Musculoskeletal: Positive for mild low back pain. Skin: Negative for rash. Neurological: Negative for headaches, focal weakness or numbness. ____________________________________________   PHYSICAL EXAM:  VITAL SIGNS: ED  Triage Vitals  Enc Vitals Group     BP 07/02/18 1238 135/76     Pulse Rate 07/02/18 1238 94     Resp --      Temp 07/02/18 1238 98.6 F (37 C)     Temp Source 07/02/18 1238 Oral     SpO2 07/02/18 1238 99 %     Weight 07/02/18 1238 204 lb (92.5 kg)     Height 07/02/18 1238 5\' 11"  (1.803 m)     Head Circumference --      Peak Flow --      Pain Score 07/02/18 1240 10     Pain Loc --      Pain Edu? --      Excl. in [place]? --    Constitutional: Alert and oriented. Well appearing and in no acute distress. Eyes: Conjunctivae are normal. PERRL. EOMI. Head: Atraumatic. Nose: No congestion/rhinnorhea. Mouth/Throat: Mucous membranes are moist.  Oropharynx mild erythema.  Enlarged cryptic tonsils with exudate.  Geographic tongue. Neck: No stridor.   Hematological/Lymphatic/Immunilogical: No cervical lymphadenopathy. Cardiovascular: Normal rate, regular rhythm. Grossly normal heart sounds.  Good peripheral circulation. Respiratory: Normal respiratory effort.  No retractions. Lungs CTAB. Musculoskeletal: Moves upper and lower extremities without any difficulty.  Normal gait was noted. Neurologic:  Normal speech and language. No gross focal neurologic deficits are appreciated. Skin:  Skin is warm, dry and intact. No rash noted. Psychiatric: Mood and affect are normal. Speech and behavior are normal.  ____________________________________________   LABS (all labs ordered are listed, but only abnormal results  are displayed)  Labs Reviewed  GROUP A STREP BY PCR    PROCEDURES  Procedure(s) performed: None  Procedures  Critical Care performed: No  ____________________________________________   INITIAL IMPRESSION / ASSESSMENT AND PLAN / ED COURSE  As part of my medical decision making, I reviewed the following data within the electronic MEDICAL RECORD NUMBER Notes from prior ED visits and [place] Controlled Substance Database  Patient presents today with complaint of 3-day history of sore throat  and close contact with his wife who was diagnosed with strep throat and currently taking antibiotics.  Patient denies any fever.  He does have exudative tonsils and strep test was reported as negative.  Patient still was placed on amoxicillin due to findings and history.  Patient was given a prescription for amoxicillin 500 mg 3 times daily for 10 days.  He is encouraged to take Tylenol as needed for fever or throat pain. ____________________________________________   FINAL CLINICAL IMPRESSION(S) / ED DIAGNOSES  Final diagnoses:  Exudative tonsillitis     ED Discharge Orders         Ordered    amoxicillin (AMOXIL) 500 MG capsule  3 times daily     07/02/18 1445           Note:  This document was prepared using Dragon voice recognition software and may include unintentional dictation errors.    Johnn Hai, PA-C 07/02/18 1558    Merlyn Lot, MD 07/02/18 [phone]

## 2018-07-02 NOTE — ED Triage Notes (Signed)
Presents with 3 day hx of sore throat  Increased pain with swallowing  Denies any fever  Also having some lower back pain

## 2018-07-05 ENCOUNTER — Emergency Department
Admission: EM | Admit: 2018-07-05 | Discharge: 2018-07-05 | Disposition: A | Discharge status: Home / Self Care | Payer: Medicaid Other | Attending: Emergency Medicine | Admitting: Emergency Medicine

## 2018-07-05 ENCOUNTER — Other Ambulatory Visit: Payer: Self-pay

## 2018-07-05 ENCOUNTER — Encounter: Payer: Self-pay | Admitting: Emergency Medicine

## 2018-07-05 DIAGNOSIS — Z79899 Other long term (current) drug therapy: Secondary | ICD-10-CM | POA: Insufficient documentation

## 2018-07-05 DIAGNOSIS — B370 Candidal stomatitis: Secondary | ICD-10-CM

## 2018-07-05 DIAGNOSIS — F17210 Nicotine dependence, cigarettes, uncomplicated: Secondary | ICD-10-CM | POA: Insufficient documentation

## 2018-07-05 MED ORDER — NYSTATIN 100000 UNIT/ML MT SUSP: 5.0000 mL | Freq: Four times a day (QID) | OROMUCOSAL | 0 refills | Status: DC

## 2018-07-05 MED ORDER — ONDANSETRON HCL 8 MG PO TABS: 8.0000 mg | ORAL_TABLET | Freq: Three times a day (TID) | ORAL | 0 refills | Status: DC | PRN

## 2018-07-05 NOTE — Discharge Instructions (Signed)
Continue previous medication and start nystatin Zofran as directed.

## 2018-07-05 NOTE — ED Provider Notes (Signed)
[facility] Emergency Department Provider Note   ____________________________________________   First MD Initiated Contact with Patient 07/05/18 1038     (approximate)  I have reviewed the triage vital signs and the nursing notes.   HISTORY  Chief Complaint Emesis and Sore Throat    HPI Joseph Beard is a 33 y.o. male patient presents with white coating to tongue.  Patient is been on 4 days of antibiotics for strep exposure.  Patient also complained of nausea and vomiting which began last night.  Patient states he works around [employer] but does not wear protective gear.  Patient denies fever associated with this complaint.    History reviewed. No pertinent past medical history.  There are no active problems to display for this patient.   Past Surgical History:  Procedure Laterality Date  . club foot surgery      Prior to Admission medications   Medication Sig Start Date End Date Taking? Authorizing Provider  amoxicillin (AMOXIL) 500 MG capsule Take 1 capsule (500 mg total) by mouth 3 (three) times daily. 07/02/18   Johnn Hai, PA-C  nystatin (MYCOSTATIN) 100000 UNIT/ML suspension Take 5 mLs (500,000 Units total) by mouth 4 (four) times daily. 07/05/18   Sable Feil, PA-C  ondansetron (ZOFRAN) 8 MG tablet Take 1 tablet (8 mg total) by mouth every 8 (eight) hours as needed for nausea or vomiting. 07/05/18   Sable Feil, PA-C    Allergies Tramadol and Vicodin [hydrocodone-acetaminophen]  No family history on file.  Social History Social History   Tobacco Use  . Smoking status: Current Every Day Smoker    Packs/day: 1.00    Types: Cigarettes  . Smokeless tobacco: Former Network engineer Use Topics  . Alcohol use: No  . Drug use: No    Review of Systems Constitutional: No fever/chills Eyes: No visual changes. ENT: Sore throat.  Coated tongue. Cardiovascular: Denies chest pain. Respiratory: Denies shortness of  breath. Gastrointestinal: No abdominal pain.  No nausea, no vomiting.  No diarrhea.  No constipation. Genitourinary: Negative for dysuria. Musculoskeletal: Negative for back pain. Skin: Negative for rash. Neurological: Negative for headaches, focal weakness or numbness. Allergic/Immunilogical: Tramadol and Vicodin. ____________________________________________   PHYSICAL EXAM:  VITAL SIGNS: ED Triage Vitals  Enc Vitals Group     BP 07/05/18 1031 128/67     Pulse Rate 07/05/18 1031 85     Resp 07/05/18 1031 18     Temp 07/05/18 1031 98.3 F (36.8 C)     Temp Source 07/05/18 1031 Oral     SpO2 07/05/18 1031 98 %     Weight 07/05/18 1034 203 lb 14.8 oz (92.5 kg)     Height 07/05/18 1034 5\' 11"  (1.803 m)     Head Circumference --      Peak Flow --      Pain Score --      Pain Loc --      Pain Edu? --      Excl. in [place]? --    Constitutional: Alert and oriented. Well appearing and in no acute distress. Mouth/Throat: Mucous membranes are moist.  Oropharynx non-erythematous.  Coated tongue. Neck: No stridor.  Hematological/Lymphatic/Immunilogical: No cervical lymphadenopathy. Cardiovascular: Normal rate, regular rhythm. Grossly normal heart sounds.  Good peripheral circulation. Respiratory: Normal respiratory effort.  No retractions. Lungs CTAB. Gastrointestinal: Soft and nontender. No distention. No abdominal bruits. No CVA tenderness. Skin:  Skin is warm, dry and intact. No rash noted. Psychiatric: Mood  and affect are normal. Speech and behavior are normal.  ____________________________________________   LABS (all labs ordered are listed, but only abnormal results are displayed)  Labs Reviewed - No data to display ____________________________________________  EKG   ____________________________________________  RADIOLOGY  ED MD interpretation:    Official radiology report(s): No results  found.  ____________________________________________   PROCEDURES  Procedure(s) performed: None  Procedures  Critical Care performed: No  ____________________________________________   INITIAL IMPRESSION / ASSESSMENT AND PLAN / ED COURSE  As part of my medical decision making, I reviewed the following data within the electronic MEDICAL RECORD NUMBER    Oral thrush secondary to antibiotics.  Advised patient to continue previous medicine.  Patient given prescription for nystatin suspension and Zofran.  Ad[facility].      ____________________________________________   FINAL CLINICAL IMPRESSION(S) / ED DIAGNOSES  Final diagnoses:  Oral thrush     ED Discharge Orders         Ordered    nystatin (MYCOSTATIN) 100000 UNIT/ML suspension  4 times daily     07/05/18 1050    ondansetron (ZOFRAN) 8 MG tablet  Every 8 hours PRN     07/05/18 1050           Note:  This document was prepared using Dragon voice recognition software and may include unintentional dictation errors.    Sable Feil, PA-C 07/05/18 1056    Lavonia Drafts, MD 07/05/18 1150

## 2018-07-05 NOTE — ED Triage Notes (Signed)
Presents with some n/v  Last time vomited was last pm   Then this am states tongue is numb and has white and black patches on tongue  States he works around [employer] and states he does not wear mask

## 2018-07-12 ENCOUNTER — Emergency Department: Payer: Self-pay

## 2018-07-12 ENCOUNTER — Emergency Department
Admission: EM | Admit: 2018-07-12 | Discharge: 2018-07-12 | Disposition: A | Discharge status: Home / Self Care | Payer: Self-pay | Attending: Emergency Medicine | Admitting: Emergency Medicine

## 2018-07-12 ENCOUNTER — Other Ambulatory Visit: Payer: Self-pay

## 2018-07-12 ENCOUNTER — Encounter: Payer: Self-pay | Admitting: Emergency Medicine

## 2018-07-12 DIAGNOSIS — R29810 Facial weakness: Secondary | ICD-10-CM | POA: Insufficient documentation

## 2018-07-12 DIAGNOSIS — G510 Bell's palsy: Secondary | ICD-10-CM

## 2018-07-12 DIAGNOSIS — F17210 Nicotine dependence, cigarettes, uncomplicated: Secondary | ICD-10-CM | POA: Insufficient documentation

## 2018-07-12 MED ORDER — VALACYCLOVIR HCL 1 G PO TABS: 1000.0000 mg | ORAL_TABLET | Freq: Three times a day (TID) | ORAL | 0 refills | Status: AC

## 2018-07-12 MED ORDER — PROPYLENE GLYCOL-GLYCERIN 0.6-0.6 % OP SOLN: OPHTHALMIC | 1 refills | Status: AC

## 2018-07-12 MED ORDER — ARTIFICIAL TEARS OPHTHALMIC OINT: TOPICAL_OINTMENT | OPHTHALMIC | 3 refills | Status: AC

## 2018-07-12 MED ORDER — PREDNISONE 20 MG PO TABS: ORAL_TABLET | ORAL | 0 refills | Status: DC

## 2018-07-12 NOTE — ED Provider Notes (Addendum)
[facility] Emergency Department Provider Note  ____________________________________________   I have reviewed the triage vital signs and the nursing notes. Where available I have reviewed prior notes and, if possible and indicated, outside hospital notes.    HISTORY  Chief Complaint Bells Palsy    HPI Joseph Beard is a 33 y.o. male who is largely healthy, recent taking amoxicillin for angitis she states, presents today complaining of right-sided facial weakness since Wednesday morning.  He states he woke up with it.  This is Friday evening.  No trauma.  No other numbness or weakness or anything else in his body.  He states it is hard to move his right side of his face and he feels it on the front right side of his tongue as well.  He has no change in vision, no difficulty talking, no neurologic complaints aside from that particular incident.  No herpetic lesions noted.  No change in his hearing.  No jaw pain    History reviewed. No pertinent past medical history.  There are no active problems to display for this patient.   Past Surgical History:  Procedure Laterality Date  . club foot surgery      Prior to Admission medications   Medication Sig Start Date End Date Taking? Authorizing Provider  amoxicillin (AMOXIL) 500 MG capsule Take 1 capsule (500 mg total) by mouth 3 (three) times daily. 07/02/18   Johnn Hai, PA-C  nystatin (MYCOSTATIN) 100000 UNIT/ML suspension Take 5 mLs (500,000 Units total) by mouth 4 (four) times daily. 07/05/18   Sable Feil, PA-C  ondansetron (ZOFRAN) 8 MG tablet Take 1 tablet (8 mg total) by mouth every 8 (eight) hours as needed for nausea or vomiting. 07/05/18   Sable Feil, PA-C    Allergies Tramadol and Vicodin [hydrocodone-acetaminophen]  No family history on file.  Social History Social History   Tobacco Use  . Smoking status: Current Every Day Smoker    Packs/day: 0.00    Types: Cigarettes  .  Smokeless tobacco: Former Network engineer Use Topics  . Alcohol use: Yes  . Drug use: No    Review of Systems Constitutional: No fever/chills Eyes: No visual changes. ENT: No sore throat. No stiff neck no neck pain Cardiovascular: Denies chest pain. Respiratory: Denies shortness of breath. Gastrointestinal:   no vomiting.  No diarrhea.  No constipation. Genitourinary: Negative for dysuria. Musculoskeletal: Negative lower extremity swelling Skin: Negative for rash. Neurological: Negative for severe headaches, see HPI   ____________________________________________   PHYSICAL EXAM:  VITAL SIGNS: ED Triage Vitals  Enc Vitals Group     BP 07/12/18 1800 129/69     Pulse Rate 07/12/18 1800 72     Resp 07/12/18 1800 16     Temp 07/12/18 1800 97.7 F (36.5 C)     Temp Source 07/12/18 1800 Oral     SpO2 07/12/18 1800 98 %     Weight 07/12/18 1802 220 lb (99.8 kg)     Height 07/12/18 1802 5\' 10"  (1.778 m)     Head Circumference --      Peak Flow --      Pain Score 07/12/18 1801 0     Pain Loc --      Pain Edu? --      Excl. in [place]? --     Constitutional: Alert and oriented. Well appearing and in no acute distress. Eyes: Conjunctivae are normal Head: Atraumatic HEENT: No congestion/rhinnorhea. Mucous membranes are moist.  Oropharynx non-erythematous, TMs are normal bilaterally Neck:   Nontender with no meningismus, no masses, no stridor Cardiovascular: Normal rate, regular rhythm. Grossly normal heart sounds.  Good peripheral circulation. Respiratory: Normal respiratory effort.  No retractions. Lungs CTAB. Abdominal: Soft and nontender. No distention. No guarding no rebound Back:  There is no focal tenderness or step off.  there is no midline tenderness there are no lesions noted. there is no CVA tenderness  Musculoskeletal: No lower extremity tenderness, no upper extremity tenderness. No joint effusions, no DVT signs strong distal pulses no edema Neurologic:  Normal speech  and language.  He has 7 facial nerve palsy, he can close his eye but only with effort on the right side, there is some slight involvement of the forehead although not significant dissymmetry, he has the sensation of decreased sensation on the right side slightly but the two-point discrimination is the same in the left and the right, there is no tongue deviation, the rest of his neurologic exam is completely normal, the rest of cranial nerves are normal, finger-to-nose within normal limits heel-to-shin is within normal limits, strength and sensation is intact everywhere else in the body, there is no change in speech, he is alert oriented able to identify watch, there is no difficulty with orientation etc.  Aside from the 7th nerve palsy, NIH stroke scale would be 0 Skin:  Skin is warm, dry and intact. No rash noted. Psychiatric: Mood and affect are normal. Speech and behavior are normal.  ____________________________________________   LABS (all labs ordered are listed, but only abnormal results are displayed)  Labs Reviewed - No data to display  Pertinent labs  results that were available during my care of the patient were reviewed by me and considered in my medical decision making (see chart for details). ____________________________________________  EKG  I personally interpreted any EKGs ordered by me or triage  ____________________________________________  RADIOLOGY  Pertinent labs & imaging results that were available during my care of the patient were reviewed by me and considered in my medical decision making (see chart for details). If possible, patient and/or family made aware of any abnormal findings.  Ct Head Wo Contrast  Result Date: 07/12/2018 CLINICAL DATA:  RIGHT facial numbness for 3 days. EXAM: CT HEAD WITHOUT CONTRAST TECHNIQUE: Contiguous axial images were obtained from the base of the skull through the vertex without intravenous contrast. COMPARISON:  CT HEAD January 18, 2007 FINDINGS: BRAIN: No intraparenchymal hemorrhage, mass effect nor midline shift. The ventricles and sulci are normal. No acute large vascular territory infarcts. No abnormal extra-axial fluid collections. Basal cisterns are patent. VASCULAR: Unremarkable. SKULL/SOFT TISSUES: No skull fracture. No significant soft tissue swelling. ORBITS/SINUSES: The included ocular globes and orbital contents are normal.Paranasal sinuses are well aerated. Mastoid air cells are well aerated. OTHER: None. IMPRESSION: Normal CT HEAD without contrast. Electronically Signed   By: Elon Alas M.D.   On: 07/12/2018 18:37   ____________________________________________    PROCEDURES  Procedure(s) performed: None  Procedures  Critical Care performed: None  ____________________________________________   INITIAL IMPRESSION / ASSESSMENT AND PLAN / ED COURSE  Pertinent labs & imaging results that were available during my care of the patient were reviewed by me and considered in my medical decision making (see chart for details).  Patient here with no herpetic lesions but a most likely Bell's palsy.  The forehead findings are present but subtle, for this reason we did do a CT scan which is negative.  Happened 2 days  ago.  I think there is no evidence the patient is having a CVA.  We will give him routine treatment for Bell's palsy have him follow-up with neurology.  Return precaution follow-up given understood.    ____________________________________________   FINAL CLINICAL IMPRESSION(S) / ED DIAGNOSES  Final diagnoses:  None      This chart was dictated using voice recognition software.  Despite best efforts to proofread,  errors can occur which can change meaning.      Schuyler Amor, MD 07/12/18 1950    Schuyler Amor, MD 07/12/18 2000

## 2018-07-12 NOTE — ED Triage Notes (Signed)
First Nurse Note:  Patient states he woke up on Wednesday with the right side of his face numb.  Patient states today he noticed that he was having trouble blinking his right eye and using the right side of his mouth.  Patient appears to be able to raise both eyebrows.  Smile is not symmetrical.  Denies weakness on either side.  Denies confusion or vision changes.

## 2018-07-12 NOTE — ED Notes (Signed)
Pt presentation discussed with EDP, McShane; see new order.

## 2018-07-12 NOTE — ED Triage Notes (Signed)
Pt in via POV, reports numbness to right side of face x 2 days, pt is otherwise neurologically intact.  Vitals WDL, NAD noted at this time.

## 2018-07-12 NOTE — Discharge Instructions (Addendum)
Believe that you have Bell's palsy.  Please read the attached information.  1 of the risk for Bell's palsy is that you are I can get dried out, use the eyedrops every day every hour, and when you go to bed use the ointment, please follow-up with the eye doctor.  If your eye hurts or becomes inflamed, call the eye doctor.  Return to the emergency room for any other neurologic symptoms or numbness or weakness anywhere else difficulty talking or walking or any other concerns.  Please follow with the  neurologist as well.

## 2018-11-19 ENCOUNTER — Emergency Department
Admission: EM | Admit: 2018-11-19 | Discharge: 2018-11-19 | Disposition: A | Discharge status: Home / Self Care | Payer: Medicaid Other | Attending: Emergency Medicine | Admitting: Emergency Medicine

## 2018-11-19 ENCOUNTER — Other Ambulatory Visit: Payer: Self-pay

## 2018-11-19 ENCOUNTER — Encounter: Payer: Self-pay | Admitting: Emergency Medicine

## 2018-11-19 DIAGNOSIS — Y929 Unspecified place or not applicable: Secondary | ICD-10-CM | POA: Insufficient documentation

## 2018-11-19 DIAGNOSIS — Y999 Unspecified external cause status: Secondary | ICD-10-CM | POA: Insufficient documentation

## 2018-11-19 DIAGNOSIS — K0889 Other specified disorders of teeth and supporting structures: Secondary | ICD-10-CM

## 2018-11-19 DIAGNOSIS — Y939 Activity, unspecified: Secondary | ICD-10-CM | POA: Insufficient documentation

## 2018-11-19 DIAGNOSIS — X58XXXA Exposure to other specified factors, initial encounter: Secondary | ICD-10-CM | POA: Insufficient documentation

## 2018-11-19 DIAGNOSIS — F17210 Nicotine dependence, cigarettes, uncomplicated: Secondary | ICD-10-CM | POA: Insufficient documentation

## 2018-11-19 DIAGNOSIS — S025XXA Fracture of tooth (traumatic), initial encounter for closed fracture: Secondary | ICD-10-CM | POA: Insufficient documentation

## 2018-11-19 MED ORDER — LIDOCAINE VISCOUS HCL 2 % MT SOLN: 15.0000 mL | OROMUCOSAL | 0 refills | Status: DC | PRN

## 2018-11-19 MED ORDER — IBUPROFEN 800 MG PO TABS
800.0000 mg | ORAL_TABLET | Freq: Once | ORAL | Status: AC
  Administered 2018-11-19: 800 mg via ORAL
  Filled 2018-11-19: qty 1

## 2018-11-19 MED ORDER — AMOXICILLIN-POT CLAVULANATE 875-125 MG PO TABS: 1.0000 | ORAL_TABLET | Freq: Two times a day (BID) | ORAL | 0 refills | Status: AC

## 2018-11-19 MED ORDER — IBUPROFEN 800 MG PO TABS: 800.0000 mg | ORAL_TABLET | Freq: Three times a day (TID) | ORAL | 0 refills | Status: DC | PRN

## 2018-11-19 MED ORDER — AMOXICILLIN-POT CLAVULANATE 875-125 MG PO TABS
1.0000 | ORAL_TABLET | Freq: Once | ORAL | Status: AC
  Administered 2018-11-19: 1 via ORAL
  Filled 2018-11-19: qty 1

## 2018-11-19 MED ORDER — LIDOCAINE VISCOUS HCL 2 % MT SOLN
15.0000 mL | Freq: Once | OROMUCOSAL | Status: AC
  Administered 2018-11-19: 15 mL via OROMUCOSAL
  Filled 2018-11-19: qty 15

## 2018-11-19 NOTE — Discharge Instructions (Addendum)
OPTIONS FOR DENTAL FOLLOW UP CARE ° °[place] Department of Health and Human Services - Local Safety Net Dental Clinics °http://www.ncdhhs.gov/dph/oralhealth/services/safetynetclinics.htm °  °[facility] (336-562-3123) ° °[facility] [place] (919-933-9087) ° °[facility] (919-663-1744 ext [phone]) ° °[facility] ([phone]) ° °[facility] ([phone]) °This clinic caters to the indigent population and is on a lottery system. °Location: °[facility], [address], [place] °Clinic Hours: °Wednesdays from 6pm - 9pm, patients seen by a lottery system. °For dates, call or go to www.med.[facility].edu/[facility]/patients/Dental-[facility] °Services: °Cleanings, fillings and simple extractions. °Payment Options: °DENTAL WORK IS FREE OF CHARGE. Bring proof of income or support. °Best way to get seen: °Arrive at 5:15 pm - this is a lottery, NOT first come/first serve, so arriving earlier will not increase your chances of being seen. °  °  °[facility] °[phone] °Select option 1 for emergencies °  °Location: °[facility], [address], [place] °Clinic Hours: °No walk-ins accepted - call the day before to schedule an appointment. °Check in times are 9:30 am and 1:30 pm. °Services: °Simple extractions, temporary fillings, pulpectomy/pulp debridement, uncomplicated abscess drainage. °Payment Options: °PAYMENT IS DUE AT THE TIME OF SERVICE.  Fee is usually $100-200, additional surgical procedures (e.g. abscess drainage) may be extra. °Cash, checks, Visa/MasterCard accepted.  Can file Medicaid if patient is covered for dental - patient should call case worker to check. °No discount for [facility] patients. °Best way to get seen: °MUST call the day before and get onto the schedule. Can usually be seen the next 1-2 days. No walk-ins accepted. °  °  °[facility] °[phone] °   °Location: °[facility], [address], [place] °Clinic Hours: °M, W, Th, F 8am or 1:30pm, Tues 9a or 1:30 - first come/first served. °Services: °Simple extractions, temporary fillings, uncomplicated abscess drainage.  You do not need to be an [place] resident. °Payment Options: °PAYMENT IS DUE AT THE TIME OF SERVICE. °Dental insurance, otherwise sliding scale - bring proof of income or support. °Depending on income and treatment needed, cost is usually $50-200. °Best way to get seen: °Arrive early as it is first come/first served. °  °  °[facility] °[phone] °  °Location: °[address] °Clinic Hours: °Mon-Thu 8a-5p °Services: °Most basic dental services including extractions and fillings. °Payment Options: °PAYMENT IS DUE AT THE TIME OF SERVICE. °Sliding scale, up to 50% off - bring proof if income or support. °Medicaid with dental option accepted. °Best way to get seen: °Call to schedule an appointment, can usually be seen within 2 weeks OR they will try to see walk-ins - show up at 8a or 2p (you may have to wait). °  °  °[facility] °919-245-2435 °[place] RESIDENTS ONLY °  °Location: °[facility], [address], [place], [place] [address] °Clinic Hours: By appointment only. °Monday - Thursday 8am-5pm, Friday 8am-12pm °Services: Cleanings, fillings, extractions. °Payment Options: °PAYMENT IS DUE AT THE TIME OF SERVICE. °Cash, Visa or MasterCard. Sliding scale - $30 minimum per service. °Best way to get seen: °Come in to office, complete packet and make an appointment - need proof of income °or support monies for each household member and proof of [place] residence. °Usually takes about a month to get in. °  °  °[facility] °[phone] °  °Location: °[address]   [place] °Clinic Hours: Walk-in Urgent Care Dental Services are offered Monday-Friday  mornings only. °The numbers of emergencies accepted daily is limited to the number of °providers available. °Maximum 15 - Mondays, Wednesdays & Thursdays °Maximum 10 - Tuesdays & Fridays °Services: °You do not need to be a [place] resident to be seen for a dental emergency. °Emergencies are defined as pain, swelling, abnormal bleeding, or dental trauma. Walkins will receive x-rays if needed. °NOTE: Dental cleaning is not an emergency. °Payment Options: °PAYMENT IS DUE AT THE TIME OF SERVICE. °Minimum co-pay is $40.00 for uninsured patients. °Minimum co-pay is $3.00 for Medicaid with dental coverage. °Dental Insurance is accepted and must be presented at time of visit. °Medicare does not cover dental. °Forms of payment: Cash, credit card, checks. °Best way to get seen: °If not previously registered with the clinic, walk-in dental registration begins at 7:15 am and is on a first come/first serve basis. °If previously registered with the clinic, call to make an appointment. °  °  °[facility] °[phone] °[place] RESIDENTS ONLY °  °Location: °[address], [place], [place] °Clinic Hours: °Mon-Thu 10a-2p °Services: Extractions only! °Payment Options: °FREE (donations accepted) - bring proof of income or support °Best way to get seen: °Call and schedule an appointment OR come at 8am on the 1st Monday of every month (except for holidays) when it is first come/first served. °  °  °[facility] °[phone] °  °Location: °[address], [place] °Clinic Hours: °Friday mornings °Services, Payment Options, Best way to get seen: °Call for info °

## 2018-11-19 NOTE — ED Triage Notes (Signed)
Patient ambulatory to triage with steady gait, without difficulty or distress noted; pt reports rt sided dental pain x 2 days

## 2018-11-19 NOTE — ED Provider Notes (Signed)
[facility] Emergency Department Provider Note  ____________________________________________  Time seen: Approximately 5:48 AM  I have reviewed the triage vital signs and the nursing notes.   HISTORY  Chief Complaint No chief complaint on file.   HPI Joseph Beard is a 33 y.o. male who presents for dental pain. Pain started yesterday morning, severe, sharp and throbbing located in the R lower molar region. No fever, trismus, drooling, difficulty swallowing or breathing.  Patient has not taken anything at home for the pain. Patient reports fracturing a tooth over a month ago but had no pain until yesterday morning. Has not seen a dentist.   PMH None - reviewed  Past Surgical History:  Procedure Laterality Date  . club foot surgery      Prior to Admission medications   Medication Sig Start Date End Date Taking? Authorizing Provider  amoxicillin (AMOXIL) 500 MG capsule Take 1 capsule (500 mg total) by mouth 3 (three) times daily. 07/02/18   Johnn Hai, PA-C  amoxicillin-clavulanate (AUGMENTIN) 875-125 MG tablet Take 1 tablet by mouth 2 (two) times daily for 10 days. 11/19/18 11/29/18  Rudene Re, MD  artificial tears (LACRILUBE) OINT ophthalmic ointment Place into the right eye every hour. 07/12/18   Schuyler Amor, MD  ibuprofen (ADVIL) 800 MG tablet Take 1 tablet (800 mg total) by mouth every 8 (eight) hours as needed. 11/19/18   Alfred Levins, [place], MD  lidocaine (XYLOCAINE) 2 % solution Use as directed 15 mLs in the mouth or throat as needed for mouth pain. 11/19/18   Alfred Levins, [place], MD  nystatin (MYCOSTATIN) 100000 UNIT/ML suspension Take 5 mLs (500,000 Units total) by mouth 4 (four) times daily. 07/05/18   Sable Feil, PA-C  ondansetron (ZOFRAN) 8 MG tablet Take 1 tablet (8 mg total) by mouth every 8 (eight) hours as needed for nausea or vomiting. 07/05/18   Sable Feil, PA-C  predniSONE (DELTASONE) 20 MG tablet 60 mg daily for five  days, then tapered by 10 mg daily, for a total treatment length of 10 days) 07/12/18   Schuyler Amor, MD  Propylene Glycol-Glycerin (SOOTHE) 0.6-0.6 % SOLN Apply at night before going to bed if you wake up in the night 07/12/18   Schuyler Amor, MD    Allergies Tramadol and Vicodin [hydrocodone-acetaminophen]  No family history on file.  Social History Social History   Tobacco Use  . Smoking status: Current Every Day Smoker    Packs/day: 0.00    Types: Cigarettes  . Smokeless tobacco: Former Network engineer Use Topics  . Alcohol use: Yes  . Drug use: No    Review of Systems  Constitutional: Negative for fever. Eyes: Negative for visual changes. ENT: Negative for sore throat. + dental pain Neck: No neck pain  Cardiovascular: Negative for chest pain. Respiratory: Negative for shortness of breath. Skin: Negative for rash. Neurological: Negative for headaches, weakness or numbness. Psych: No SI or HI  ____________________________________________   PHYSICAL EXAM:  VITAL SIGNS: ED Triage Vitals  Enc Vitals Group     BP 11/19/18 0437 (!) 145/76     Pulse Rate 11/19/18 0437 86     Resp 11/19/18 0437 18     Temp 11/19/18 0437 97.7 F (36.5 C)     Temp Source 11/19/18 0437 Oral     SpO2 11/19/18 0437 97 %     Weight 11/19/18 0437 173 lb (78.5 kg)     Height 11/19/18 0437 5\' 11"  (1.803  m)     Head Circumference --      Peak Flow --      Pain Score 11/19/18 0438 10     Pain Loc --      Pain Edu? --      Excl. in [place]? --     Constitutional: Alert and oriented. Well appearing and in no apparent distress. HEENT:      Head: Normocephalic and atraumatic.         Eyes: Conjunctivae are normal. Sclera is non-icteric.       Mouth/Throat: Mucous membranes are moist. Patient with several cavities, fractured R lower molar with cavity. No trismus      Neck: Supple with no signs of meningismus. Cardiovascular: Regular rate and rhythm. No murmurs, gallops, or rubs. 2+  symmetrical distal pulses are present in all extremities. No JVD. Respiratory: Normal respiratory effort. Lungs are clear to auscultation bilaterally. No wheezes, crackles, or rhonchi.  Neurologic: Normal speech and language. Face is symmetric. Moving all extremities. No gross focal neurologic deficits are appreciated. Skin: Skin is warm, dry and intact. No rash noted. Psychiatric: Mood and affect are normal. Speech and behavior are normal.  ____________________________________________   LABS (all labs ordered are listed, but only abnormal results are displayed)  Labs Reviewed - No data to display ____________________________________________  EKG  none  ____________________________________________  RADIOLOGY  none  ____________________________________________   PROCEDURES  Procedure(s) performed: None Procedures Critical Care performed:  None ____________________________________________   INITIAL IMPRESSION / ASSESSMENT AND PLAN / ED COURSE  33 y.o. male who presents for dental pain.  Patient has a fractured right lower molar.  He reports that the fracture happened more than a month ago the pain started yesterday morning.  He also has several cavities.  No gum disease, no abscess, no trismus.  Patient given viscous lidocaine, ibuprofen, Augmentin and discharged home with referral to dentist.  Discussed my standard return precautions      As part of my medical decision making, I reviewed the following data within the [place] notes reviewed and incorporated, Old chart reviewed, Notes from prior ED visits and [place] Controlled Substance Database    Pertinent labs & imaging results that were available during my care of the patient were reviewed by me and considered in my medical decision making (see chart for details).    ____________________________________________   FINAL CLINICAL IMPRESSION(S) / ED DIAGNOSES  Final diagnoses:  Pain, dental   Closed fracture of tooth, initial encounter      NEW MEDICATIONS STARTED DURING THIS VISIT:  ED Discharge Orders         Ordered    lidocaine (XYLOCAINE) 2 % solution  As needed     11/19/18 0548    ibuprofen (ADVIL) 800 MG tablet  Every 8 hours PRN     11/19/18 0548    amoxicillin-clavulanate (AUGMENTIN) 875-125 MG tablet  2 times daily     11/19/18 0548           Note:  This document was prepared using Dragon voice recognition software and may include unintentional dictation errors.    Alfred Levins, [place], MD 11/19/18 [phone]

## 2020-06-30 ENCOUNTER — Emergency Department
Admission: EM | Admit: 2020-06-30 | Discharge: 2020-07-01 | Disposition: A | Discharge status: Home / Self Care | Payer: Medicaid Other | Attending: Emergency Medicine | Admitting: Emergency Medicine

## 2020-06-30 ENCOUNTER — Other Ambulatory Visit: Payer: Self-pay

## 2020-06-30 ENCOUNTER — Encounter: Payer: Self-pay | Admitting: *Deleted

## 2020-06-30 DIAGNOSIS — Z5321 Procedure and treatment not carried out due to patient leaving prior to being seen by health care provider: Secondary | ICD-10-CM | POA: Insufficient documentation

## 2020-06-30 DIAGNOSIS — R1110 Vomiting, unspecified: Secondary | ICD-10-CM | POA: Insufficient documentation

## 2020-06-30 DIAGNOSIS — R1010 Upper abdominal pain, unspecified: Secondary | ICD-10-CM | POA: Insufficient documentation

## 2020-06-30 DIAGNOSIS — R079 Chest pain, unspecified: Secondary | ICD-10-CM | POA: Insufficient documentation

## 2020-06-30 LAB — COMPREHENSIVE METABOLIC PANEL
ALT: 37 U/L (ref 0–44)
AST: 23 U/L (ref 15–41)
Albumin: 5 g/dL (ref 3.5–5.0)
Alkaline Phosphatase: 70 U/L (ref 38–126)
Anion gap: 12 (ref 5–15)
BUN: 13 mg/dL (ref 6–20)
CO2: 23 mmol/L (ref 22–32)
Calcium: 9.3 mg/dL (ref 8.9–10.3)
Chloride: 103 mmol/L (ref 98–111)
Creatinine, Ser: 0.71 mg/dL (ref 0.61–1.24)
GFR, Estimated: >60 mL/min (ref >60)
Glucose, Bld: 99 mg/dL (ref 70–99)
Potassium: 3.8 mmol/L (ref 3.5–5.1)
Sodium: 138 mmol/L (ref 135–145)
Total Bilirubin: 1.2 mg/dL (ref 0.3–1.2)
Total Protein: 7.7 g/dL (ref 6.5–8.1)

## 2020-06-30 LAB — CBC
HCT: 45.4 % (ref 39.0–52.0)
Hemoglobin: 15.9 g/dL (ref 13.0–17.0)
MCH: 30.1 pg (ref 26.0–34.0)
MCHC: 35 g/dL (ref 30.0–36.0)
MCV: 86 fL (ref 80.0–100.0)
Platelets: 242 10*3/uL (ref 150–400)
RBC: 5.28 MIL/uL (ref 4.22–5.81)
RDW: 12.9 % (ref 11.5–15.5)
WBC: 11.7 10*3/uL — ABNORMAL HIGH (ref 4.0–10.5)
nRBC: 0 % (ref 0.0–0.2)

## 2020-06-30 LAB — LIPASE, BLOOD: Lipase: 27 U/L (ref 11–51)

## 2020-06-30 LAB — URINALYSIS, COMPLETE (UACMP) WITH MICROSCOPIC
Bacteria, UA: NONE SEEN
Glucose, UA: NEGATIVE mg/dL
Hgb urine dipstick: NEGATIVE
Ketones, ur: 20 mg/dL — AB
Leukocytes,Ua: NEGATIVE
Nitrite: NEGATIVE
Protein, ur: 30 mg/dL — AB
Specific Gravity, Urine: 1.035 — ABNORMAL HIGH (ref 1.005–1.030)
pH: 5 (ref 5.0–8.0)

## 2020-06-30 LAB — TROPONIN I (HIGH SENSITIVITY): Troponin I (High Sensitivity): <2 ng/L (ref <18)

## 2020-06-30 NOTE — ED Notes (Signed)
Pt unable to void at this time. 

## 2020-06-30 NOTE — ED Triage Notes (Signed)
Pt ambulatory to triage.  Pt reports vomiting x 5 today with upper abd pain.  Pt woke up this morning with pain and vomiting.  Pt alert   Speech clear.

## 2020-07-01 NOTE — ED Notes (Signed)
No answer when called several times from lobby 

## 2020-10-16 ENCOUNTER — Other Ambulatory Visit: Payer: Self-pay

## 2020-10-16 ENCOUNTER — Emergency Department
Admission: EM | Admit: 2020-10-16 | Discharge: 2020-10-16 | Disposition: A | Discharge status: Home / Self Care | Payer: Medicaid Other | Attending: Emergency Medicine | Admitting: Emergency Medicine

## 2020-10-16 DIAGNOSIS — R42 Dizziness and giddiness: Secondary | ICD-10-CM | POA: Insufficient documentation

## 2020-10-16 DIAGNOSIS — Z8616 Personal history of COVID-19: Secondary | ICD-10-CM | POA: Insufficient documentation

## 2020-10-16 DIAGNOSIS — Z5321 Procedure and treatment not carried out due to patient leaving prior to being seen by health care provider: Secondary | ICD-10-CM | POA: Insufficient documentation

## 2020-10-16 LAB — BASIC METABOLIC PANEL
Anion gap: 10 (ref 5–15)
BUN: 10 mg/dL (ref 6–20)
CO2: 19 mmol/L — ABNORMAL LOW (ref 22–32)
Calcium: 8.8 mg/dL — ABNORMAL LOW (ref 8.9–10.3)
Chloride: 106 mmol/L (ref 98–111)
Creatinine, Ser: 0.65 mg/dL (ref 0.61–1.24)
GFR, Estimated: >60 mL/min (ref >60)
Glucose, Bld: 134 mg/dL — ABNORMAL HIGH (ref 70–99)
Potassium: 3.5 mmol/L (ref 3.5–5.1)
Sodium: 135 mmol/L (ref 135–145)

## 2020-10-16 LAB — CBC
HCT: 46.2 % (ref 39.0–52.0)
Hemoglobin: 15.6 g/dL (ref 13.0–17.0)
MCH: 29.2 pg (ref 26.0–34.0)
MCHC: 33.8 g/dL (ref 30.0–36.0)
MCV: 86.5 fL (ref 80.0–100.0)
Platelets: 173 10*3/uL (ref 150–400)
RBC: 5.34 MIL/uL (ref 4.22–5.81)
RDW: 13.3 % (ref 11.5–15.5)
WBC: 4.4 10*3/uL (ref 4.0–10.5)
nRBC: 0 % (ref 0.0–0.2)

## 2020-10-16 NOTE — ED Triage Notes (Signed)
Pt states since 3PM yesterday he has had dizziness- pt states it feels like the room is spinning- pt states his sister recently tested positive for covid- pt denies any other symptoms

## 2020-10-16 NOTE — ED Notes (Signed)
Pt did not answer x3. Attempted to call cell phone but is turned off. Unable to reach pt.

## 2021-09-23 ENCOUNTER — Encounter: Payer: Self-pay | Admitting: Emergency Medicine

## 2021-09-23 ENCOUNTER — Emergency Department
Admission: EM | Admit: 2021-09-23 | Discharge: 2021-09-23 | Disposition: A | Discharge status: Home / Self Care | Payer: Medicaid Other | Attending: Emergency Medicine | Admitting: Emergency Medicine

## 2021-09-23 ENCOUNTER — Other Ambulatory Visit: Payer: Self-pay

## 2021-09-23 ENCOUNTER — Emergency Department: Payer: Medicaid Other

## 2021-09-23 DIAGNOSIS — M25561 Pain in right knee: Secondary | ICD-10-CM

## 2021-09-23 DIAGNOSIS — S8391XA Sprain of unspecified site of right knee, initial encounter: Secondary | ICD-10-CM | POA: Insufficient documentation

## 2021-09-23 DIAGNOSIS — X58XXXA Exposure to other specified factors, initial encounter: Secondary | ICD-10-CM | POA: Insufficient documentation

## 2021-09-23 NOTE — ED Triage Notes (Signed)
Pt comes into the ED via POV c/o left knee pain.  Pt denies any known injury to the knee.  Pt is able to ambulate to triage and has no obvious deformity.  ?

## 2021-09-23 NOTE — ED Provider Notes (Signed)
? ? ?[facility] ?Emergency Department Provider Note ? ? ? ? Event Date/Time  ? First MD Initiated Contact with Patient 09/23/21 1140   ?  (approximate) ? ? ?History  ? ?Knee Pain ? ? ?HPI ? ?ANTHONYMICHAEL MUNDAY is a 36 y.o. male with remote history of knee surgery, presents to the ED for evaluation of left knee pain.  Patient reports acute onset to the knee without previous injury, trauma, or fall.  Patient ambulates without difficulty, but notes intermittent sharp pain to the anterior knee When he stands and bears weight.  He feels that the knee is fine to give out when the pain shoot through.  He denies any catch, lock, swelling. ? ? ?Physical Exam  ? ?Triage Vital Signs: ?ED Triage Vitals  ?Enc Vitals Group  ?   BP 09/23/21 1127 137/78  ?   Pulse Rate 09/23/21 1127 77  ?   Resp 09/23/21 1127 16  ?   Temp 09/23/21 1127 98.3 ?F (36.8 ?C)  ?   Temp Source 09/23/21 1127 Oral  ?   SpO2 09/23/21 1127 97 %  ?   Weight 09/23/21 1122 207 lb 14.3 oz (94.3 kg)  ?   Height 09/23/21 1122 '5\' 11"'$  (1.803 m)  ?   Head Circumference --   ?   Peak Flow --   ?   Pain Score 09/23/21 1122 10  ?   Pain Loc --   ?   Pain Edu? --   ?   Excl. in [place]? --   ? ? ?Most recent vital signs: ?Vitals:  ? 09/23/21 1127  ?BP: 137/78  ?Pulse: 77  ?Resp: 16  ?Temp: 98.3 ?F (36.8 ?C)  ?SpO2: 97%  ? ? ?General Awake, no distress.  ?CV:  Good peripheral perfusion.  ?RESP:  Normal effort.  ?ABD:  No distention.  ?MSK:  Left knee without deformity, dislocation, or joint effusion.  Patient with full active range of motion to the knee.  No patella ballottement appreciated.  Normal patellar tracking is noted.  No significant valgus or varus when stress is elicited.  Patient nontender to palpation to the inferior patella at the patella tendon insertion.  No calf or Achilles tenderness noted distally. ? ? ?ED Results / Procedures / Treatments  ? ?Labs ?(all labs ordered are listed, but only abnormal results are displayed) ?Labs Reviewed - No  data to display ? ? ?EKG ? ? ?RADIOLOGY ? ?I personally viewed and evaluated these images as part of my medical decision making, as well as reviewing the written report by the radiologist. ? ?ED Provider Interpretation: no acute findings} ? ?DG Knee Complete 4 Views Left ? ?Result Date: 09/23/2021 ?CLINICAL DATA:  Knee pain. EXAM: LEFT KNEE - COMPLETE 4 VIEW COMPARISON:  None. FINDINGS: No evidence of fracture, dislocation, or joint effusion. No evidence of arthropathy or other focal bone abnormality. Soft tissues are unremarkable. IMPRESSION: Negative. Electronically Signed   By: Yetta Glassman M.D.   On: 09/23/2021 11:43   ? ? ?PROCEDURES: ? ?Critical Care performed: No ? ?Procedures ? ? ?MEDICATIONS ORDERED IN ED: ?Medications - No data to display ? ? ?IMPRESSION / MDM / ASSESSMENT AND PLAN / ED COURSE  ?I reviewed the triage vital signs and the nursing notes. ?             ?               ? ?Differential diagnosis includes, but is not limited  to, knee sprain, knee contusion, OA ? ?Patient to the ED for evaluation of acute left knee pain without preceding injury or trauma.  Patient presents in no acute distress noting left knee pain is aggravated by standing and walking.  Exam is reassuring overall as shows no signs of internal derangement.  No joint effusion is appreciated but no radiologic evidence of any acute fracture or dislocation or effusions, based on my review of images.  Patient's diagnosis is consistent with acute knee pain secondary to strain. Patient will be discharged home with instructions to take OTC Tylenol or Motrin. Patient is to follow up with Ortho as needed or otherwise directed. Patient is given ED precautions to return to the ED for any worsening or new symptoms. ? ? ?FINAL CLINICAL IMPRESSION(S) / ED DIAGNOSES  ? ?Final diagnoses:  ?Acute pain of right knee  ?Sprain of right knee, unspecified ligament, initial encounter  ? ? ? ?Rx / DC Orders  ? ?ED Discharge Orders   ? ? None  ? ?   ? ? ? ?Note:  This document was prepared using Dragon voice recognition software and may include unintentional dictation errors. ? ?  ?Sophee Mckimmy, Dannielle Karvonen, PA-C ?09/23/21 1224 ? ?  ?Blake Divine, MD ?09/27/21 1610 ? ?

## 2021-09-23 NOTE — Discharge Instructions (Addendum)
Your exam and x-ray are negative at this time for any acute fractures or dislocation.  You may have a sprain to the knee.  Wear the brace as needed for support and use of crutches ambulate with weightbearing as tolerated.  He should follow-up with orthopedics for ongoing concerns. ?

## 2021-09-23 NOTE — ED Notes (Signed)
27 yom with a c/c of left sided knee pain since yesterday. The pt is unsure of any injuries.  ?

## 2022-02-27 ENCOUNTER — Emergency Department
Admission: EM | Admit: 2022-02-27 | Discharge: 2022-02-27 | Disposition: A | Discharge status: Home / Self Care | Payer: Medicaid Other | Attending: Emergency Medicine | Admitting: Emergency Medicine

## 2022-02-27 ENCOUNTER — Emergency Department: Payer: Medicaid Other

## 2022-02-27 ENCOUNTER — Other Ambulatory Visit: Payer: Self-pay

## 2022-02-27 ENCOUNTER — Encounter: Payer: Self-pay | Admitting: Intensive Care

## 2022-02-27 DIAGNOSIS — W228XXA Striking against or struck by other objects, initial encounter: Secondary | ICD-10-CM | POA: Insufficient documentation

## 2022-02-27 DIAGNOSIS — S60221A Contusion of right hand, initial encounter: Secondary | ICD-10-CM | POA: Insufficient documentation

## 2022-02-27 LAB — COMPREHENSIVE METABOLIC PANEL
ALT: 42 U/L (ref 0–44)
AST: 22 U/L (ref 15–41)
Albumin: 4.8 g/dL (ref 3.5–5.0)
Alkaline Phosphatase: 78 U/L (ref 38–126)
Anion gap: 8 (ref 5–15)
BUN: 14 mg/dL (ref 6–20)
CO2: 24 mmol/L (ref 22–32)
Calcium: 9.5 mg/dL (ref 8.9–10.3)
Chloride: 109 mmol/L (ref 98–111)
Creatinine, Ser: 0.77 mg/dL (ref 0.61–1.24)
GFR, Estimated: >60 mL/min (ref >60)
Glucose, Bld: 95 mg/dL (ref 70–99)
Potassium: 4.5 mmol/L (ref 3.5–5.1)
Sodium: 141 mmol/L (ref 135–145)
Total Bilirubin: 1 mg/dL (ref 0.3–1.2)
Total Protein: 7.7 g/dL (ref 6.5–8.1)

## 2022-02-27 LAB — ACETAMINOPHEN LEVEL: Acetaminophen (Tylenol), Serum: <10 ug/mL — ABNORMAL LOW (ref 10–30)

## 2022-02-27 LAB — CBC
HCT: 49.4 % (ref 39.0–52.0)
Hemoglobin: 16.7 g/dL (ref 13.0–17.0)
MCH: 29.4 pg (ref 26.0–34.0)
MCHC: 33.8 g/dL (ref 30.0–36.0)
MCV: 87 fL (ref 80.0–100.0)
Platelets: 234 10*3/uL (ref 150–400)
RBC: 5.68 MIL/uL (ref 4.22–5.81)
RDW: 13.4 % (ref 11.5–15.5)
WBC: 7.4 10*3/uL (ref 4.0–10.5)
nRBC: 0 % (ref 0.0–0.2)

## 2022-02-27 LAB — ETHANOL: Alcohol, Ethyl (B): <10 mg/dL (ref <10)

## 2022-02-27 LAB — TROPONIN I (HIGH SENSITIVITY): Troponin I (High Sensitivity): 4 ng/L (ref <18)

## 2022-02-27 LAB — SALICYLATE LEVEL: Salicylate Lvl: <7 mg/dL — ABNORMAL LOW (ref 7.0–30.0)
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
## 2022-02-27 NOTE — Discharge Instructions (Addendum)
You should ice and elevate the hand to help decrease the swelling.  You may use the brace over the next few days for comfort.  You may go to the [place] and they can help you apply for Medicaid.  We have also given you a packet with information on [place] financial aid as well as some area low-cost clinics.  You may follow-up with [facility] for mental health evaluation.  Return to the ER immediately for new, worsening, or persistent severe mental health symptoms including worsening anger, thoughts of wanting to harm yourself or others, or any other new or worsening symptoms that concern you.
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
## 2022-03-25 ENCOUNTER — Emergency Department
Admission: EM | Admit: 2022-03-25 | Discharge: 2022-03-25 | Disposition: A | Discharge status: Home / Self Care | Payer: Medicaid Other | Attending: Emergency Medicine | Admitting: Emergency Medicine

## 2022-03-25 ENCOUNTER — Emergency Department: Payer: Medicaid Other

## 2022-03-25 ENCOUNTER — Other Ambulatory Visit: Payer: Self-pay

## 2022-03-25 DIAGNOSIS — N202 Calculus of kidney with calculus of ureter: Secondary | ICD-10-CM | POA: Insufficient documentation

## 2022-03-25 DIAGNOSIS — N200 Calculus of kidney: Secondary | ICD-10-CM

## 2022-03-25 LAB — CBC
HCT: 50.2 % (ref 39.0–52.0)
Hemoglobin: 17.1 g/dL — ABNORMAL HIGH (ref 13.0–17.0)
MCH: 29.2 pg (ref 26.0–34.0)
MCHC: 34.1 g/dL (ref 30.0–36.0)
MCV: 85.7 fL (ref 80.0–100.0)
Platelets: 301 10*3/uL (ref 150–400)
RBC: 5.86 MIL/uL — ABNORMAL HIGH (ref 4.22–5.81)
RDW: 13.3 % (ref 11.5–15.5)
WBC: 10.8 10*3/uL — ABNORMAL HIGH (ref 4.0–10.5)
nRBC: 0 % (ref 0.0–0.2)

## 2022-03-25 LAB — COMPREHENSIVE METABOLIC PANEL
ALT: 37 U/L (ref 0–44)
AST: 28 U/L (ref 15–41)
Albumin: 4.9 g/dL (ref 3.5–5.0)
Alkaline Phosphatase: 91 U/L (ref 38–126)
Anion gap: 9 (ref 5–15)
BUN: 16 mg/dL (ref 6–20)
CO2: 21 mmol/L — ABNORMAL LOW (ref 22–32)
Calcium: 9.5 mg/dL (ref 8.9–10.3)
Chloride: 107 mmol/L (ref 98–111)
Creatinine, Ser: 1.01 mg/dL (ref 0.61–1.24)
GFR, Estimated: >60 mL/min (ref >60)
Glucose, Bld: 147 mg/dL — ABNORMAL HIGH (ref 70–99)
Potassium: 3.7 mmol/L (ref 3.5–5.1)
Sodium: 137 mmol/L (ref 135–145)
Total Bilirubin: 1 mg/dL (ref 0.3–1.2)
Total Protein: 8.1 g/dL (ref 6.5–8.1)

## 2022-03-25 LAB — URINALYSIS, ROUTINE W REFLEX MICROSCOPIC
Bacteria, UA: NONE SEEN
Bilirubin Urine: NEGATIVE
Glucose, UA: NEGATIVE mg/dL
Ketones, ur: 40 mg/dL — AB
Leukocytes,Ua: NEGATIVE
Nitrite: NEGATIVE
Protein, ur: 100 mg/dL — AB
RBC / HPF: >50 RBC/hpf — ABNORMAL HIGH (ref 0–5)
Specific Gravity, Urine: 1.01 (ref 1.005–1.030)
pH: >9 — ABNORMAL HIGH (ref 5.0–8.0)

## 2022-03-25 LAB — LIPASE, BLOOD: Lipase: 37 U/L (ref 11–51)

## 2022-03-25 MED ORDER — IBUPROFEN 600 MG PO TABS: 600.0000 mg | ORAL_TABLET | Freq: Four times a day (QID) | ORAL | 0 refills | Status: AC | PRN

## 2022-03-25 MED ORDER — FENTANYL CITRATE PF 50 MCG/ML IJ SOSY
100.0000 ug | PREFILLED_SYRINGE | Freq: Once | INTRAMUSCULAR | Status: AC
  Administered 2022-03-25: 100 ug via INTRAVENOUS
  Filled 2022-03-25: qty 2

## 2022-03-25 MED ORDER — FENTANYL CITRATE PF 50 MCG/ML IJ SOSY
75.0000 ug | PREFILLED_SYRINGE | Freq: Once | INTRAMUSCULAR | Status: AC
  Administered 2022-03-25: 75 ug via INTRAVENOUS
  Filled 2022-03-25: qty 2

## 2022-03-25 MED ORDER — SODIUM CHLORIDE 0.9 % IV BOLUS
1000.0000 mL | Freq: Once | INTRAVENOUS | Status: AC
  Administered 2022-03-25: 1000 mL via INTRAVENOUS

## 2022-03-25 MED ORDER — OXYCODONE HCL 5 MG PO TABS: 5.0000 mg | ORAL_TABLET | Freq: Four times a day (QID) | ORAL | 0 refills | Status: AC | PRN

## 2022-03-25 MED ORDER — OXYCODONE HCL 5 MG PO TABS
5.0000 mg | ORAL_TABLET | Freq: Once | ORAL | Status: AC
  Administered 2022-03-25: 5 mg via ORAL
  Filled 2022-03-25: qty 1

## 2022-03-25 MED ORDER — TAMSULOSIN HCL 0.4 MG PO CAPS: 0.4000 mg | ORAL_CAPSULE | Freq: Every day | ORAL | 0 refills | Status: AC

## 2022-03-25 MED ORDER — ONDANSETRON HCL 4 MG/2ML IJ SOLN
4.0000 mg | Freq: Once | INTRAMUSCULAR | Status: AC
  Administered 2022-03-25: 4 mg via INTRAVENOUS
  Filled 2022-03-25: qty 2

## 2022-03-25 MED ORDER — ONDANSETRON 4 MG PO TBDP: 4.0000 mg | ORAL_TABLET | Freq: Three times a day (TID) | ORAL | 0 refills | Status: AC | PRN

## 2022-03-25 MED ORDER — KETOROLAC TROMETHAMINE 15 MG/ML IJ SOLN
15.0000 mg | Freq: Once | INTRAMUSCULAR | Status: AC
  Administered 2022-03-25: 15 mg via INTRAVENOUS
  Filled 2022-03-25: qty 1

## 2022-03-25 MED ORDER — IOHEXOL 300 MG/ML  SOLN
100.0000 mL | Freq: Once | INTRAMUSCULAR | Status: AC | PRN
  Administered 2022-03-25: 100 mL via INTRAVENOUS

## 2022-03-25 MED ORDER — ACETAMINOPHEN 500 MG PO TABS
1000.0000 mg | ORAL_TABLET | Freq: Once | ORAL | Status: AC
  Administered 2022-03-25: 1000 mg via ORAL
  Filled 2022-03-25: qty 2

## 2022-03-25 NOTE — ED Triage Notes (Addendum)
Pt [place] EMS [place] school [place] lot- pt started with LLQ pain last night that he describes as stabbing and it radiates to L flank- pt denies urinary symptoms but has had blood in stools for the last couple months- pt actively vomiting in triage- VSS per EMS

## 2022-03-25 NOTE — Discharge Instructions (Addendum)
You have a kidney stone. See report below.   Take ibuprofen '600mg'$  every 8 hours daily (as long as you are not on any other blood thinners or have kidney disease) Take tylenol 1g every 8 hours daily. Take oxycodone for breakthrough pain. Do not drive, work, or operate machinery while on this.  DO NOT TAKE IF YOU DEVELOP RASH Take zofran to help with nausea. Take Flomax to help dilate uretha. Call urology number above to schedule outpatient appointment. Return to ED for fevers, unable to keep food down, or any other concerns.   Call GI for your blood in stool to discuss colonscopy.      IMPRESSION: 1. Acute obstructive uropathy on the Left due to a 2-3 mm distal ureteral calculus just upstream to the left UVJ. 2. No other urinary calculus, and otherwise negative abdomen and pelvis.

## 2022-03-25 NOTE — ED Provider Notes (Signed)
[facility] Provider Note    Event Date/Time   First MD Initiated Contact with Patient 03/25/22 [phone]     (approximate)   History   Abdominal Pain   HPI  Joseph Beard is a 36 y.o. male who comes in with concern for left lower quadrant pain that started last night.  Patient reports some left lower quadrant pain that is going into his flank without any urinary symptoms.  He did report having some blood in his stool that was bright red blood for the past few months.  None currently.  Does report some vomiting as well.  Physical Exam   Triage Vital Signs: ED Triage Vitals  Enc Vitals Group     BP 03/25/22 0807 (!) 128/97     Pulse Rate 03/25/22 0807 72     Resp 03/25/22 0807 20     Temp 03/25/22 0807 97.9 F (36.6 C)     Temp Source 03/25/22 0807 Oral     SpO2 03/25/22 0807 97 %     Weight 03/25/22 0809 203 lb (92.1 kg)     Height 03/25/22 0809 '5\' 11"'$  (1.803 m)     Head Circumference --      Peak Flow --      Pain Score 03/25/22 0808 10     Pain Loc --      Pain Edu? --      Excl. in [place]? --     Most recent vital signs: Vitals:   03/25/22 0807  BP: (!) 128/97  Pulse: 72  Resp: 20  Temp: 97.9 F (36.6 C)  SpO2: 97%     General: Awake, no distress.  CV:  Good peripheral perfusion.  Resp:  Normal effort.  Abd:  No distention.  Tender in the left flank Other:  Rectal exam with brown stool.  No gross blood.  External hemorrhoid noted   ED Results / Procedures / Treatments   Labs (all labs ordered are listed, but only abnormal results are displayed) Labs Reviewed  COMPREHENSIVE METABOLIC PANEL - Abnormal; Notable for the following components:      Result Value   CO2 21 (*)    Glucose, Bld 147 (*)    All other components within normal limits  CBC - Abnormal; Notable for the following components:   WBC 10.8 (*)    RBC 5.86 (*)    Hemoglobin 17.1 (*)    All other components within normal limits  LIPASE, BLOOD  URINALYSIS, ROUTINE  W REFLEX MICROSCOPIC       RADIOLOGY I have reviewed the CT personally and interpreted and patient has a left-sided kidney stone  PROCEDURES:  Critical Care performed: No  Procedures   MEDICATIONS ORDERED IN ED: Medications - No data to display   IMPRESSION / MDM / [place] / ED COURSE  I reviewed the triage vital signs and the nursing notes.   Patient's presentation is most consistent with acute presentation with potential threat to life or bodily function.  Differential is anemia, kidney stone, diverticulitis, colon mass.  Given the blood in the stool I did do a CT with contrast although he has no current bleeding on examination and he has a hemorrhoid on exam so I suspect that this is probably unrelated and more likely related to hemorrhoids.  Was given some IV fentanyl to help with pain.  CBC shows slightly elevated white count at 10.8.  His hemoglobin is stable at 17.1.  CMP reassuring. LIPASE  RE-ASSURING    IMPRESSION: 1. Acute obstructive uropathy on the Left due to a 2-3 mm distal ureteral calculus just upstream to the left UVJ. 2. No other urinary calculus, and otherwise negative abdomen and pelvis.  UA without evidence of UTI.  He is got no fever.  Patient has an allergy noted to hydrocodone and tramadol and we discussed doing a dose of oxycodone and the risk of this including anaphylaxis but patient states that he would like to proceed with trial given he does not really remember having the rash and that this pain is too severe.  Patient also given some Toradol, Tylenol.  Patient has had an hour and a half since given the oxycodone and he has not had any rash.  He would like to proceed with a course of this.  We discussed not taking if he does develop a rash and taking some Benadryl.  He expressed understanding but felt comfortable with this plan.  For the concern for months of intermittent rectal bleeding he will follow-up with GI to discuss colonoscopy  but I suspect this more likely related to hemorrhoids.  He understands to return to the ER for repeat evaluation if he develops any worsening concerns  The patient is on the cardiac monitor to evaluate for evidence of arrhythmia and/or significant heart rate changes.      FINAL CLINICAL IMPRESSION(S) / ED DIAGNOSES   Final diagnoses:  Kidney stone     Rx / DC Orders   ED Discharge Orders          Ordered    ibuprofen (ADVIL) 600 MG tablet  Every 6 hours PRN        03/25/22 1248    ondansetron (ZOFRAN-ODT) 4 MG disintegrating tablet  Every 8 hours PRN        03/25/22 1248    tamsulosin (FLOMAX) 0.4 MG CAPS capsule  Daily        03/25/22 1248    oxyCODONE (ROXICODONE) 5 MG immediate release tablet  Every 6 hours PRN        03/25/22 1248             Note:  This document was prepared using Dragon voice recognition software and may include unintentional dictation errors.   Vanessa [place], MD 03/25/22 1249

## 2022-08-30 IMAGING — DX DG KNEE COMPLETE 4+V*L*
4 series · 4 of 4 positions shown · non-contrast
Comparison: None.

CLINICAL DATA: Knee pain.

EXAM:
LEFT KNEE - COMPLETE 4 VIEW

[knee ap]
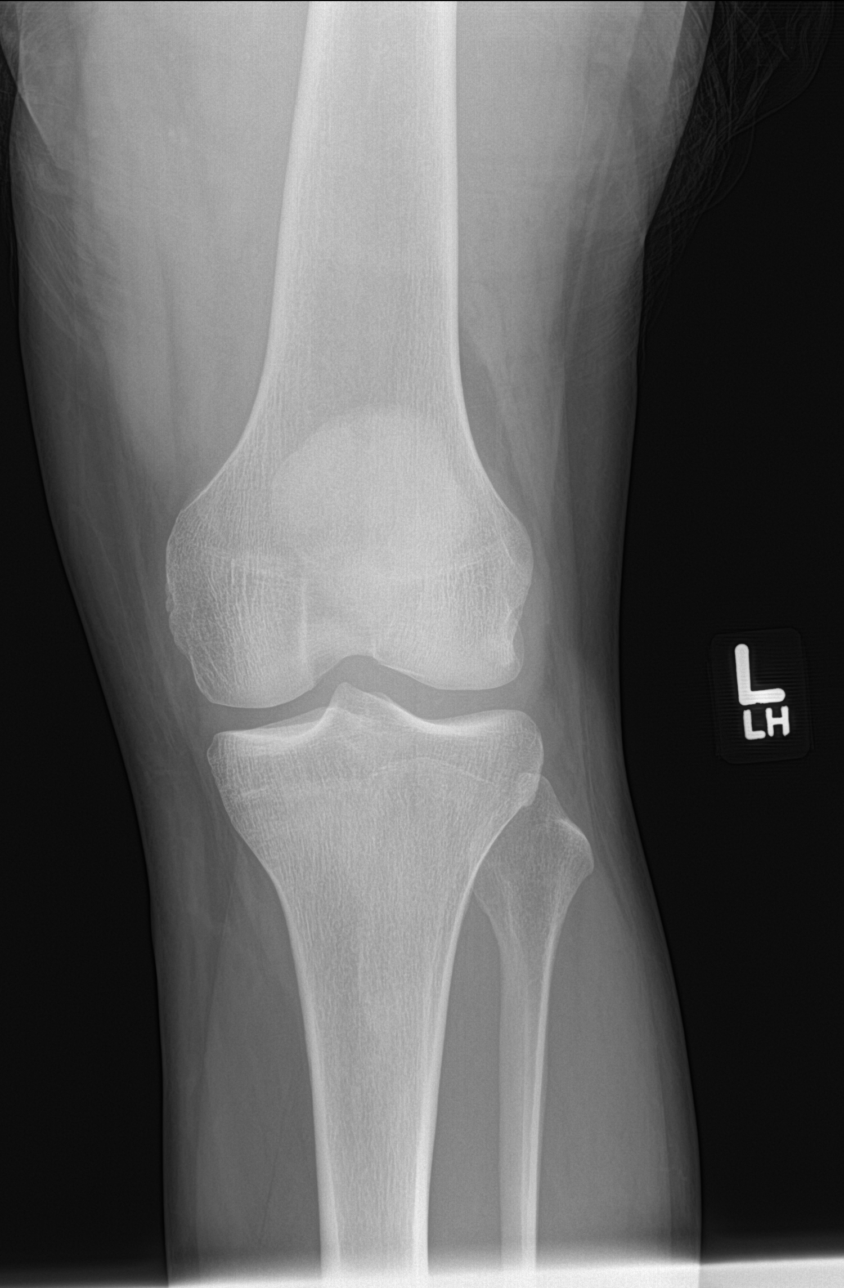

[knee lat]
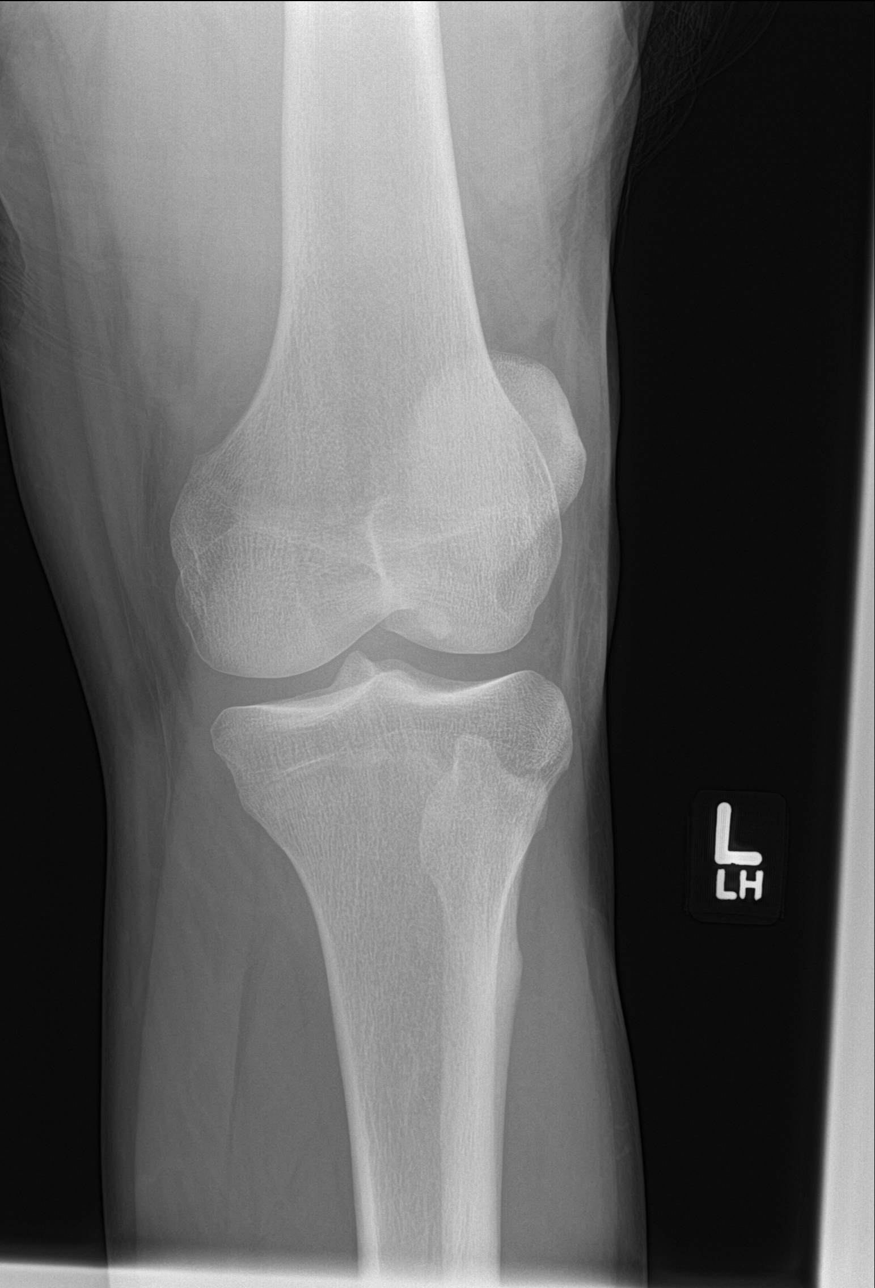

[patella skyline]
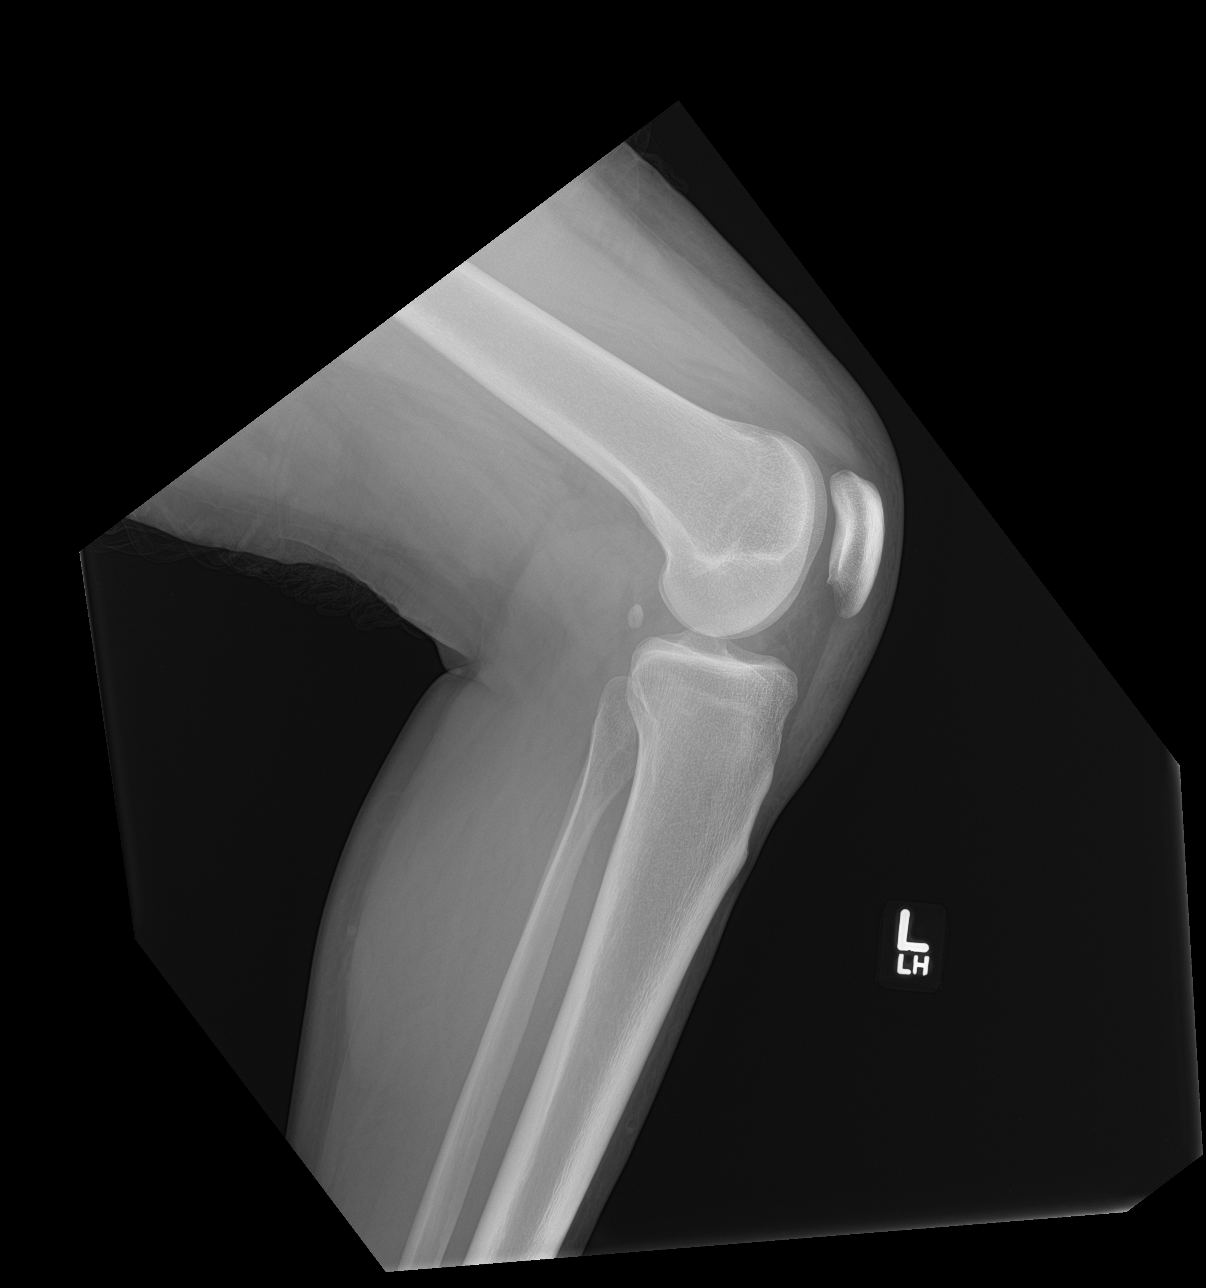

[knee obl]
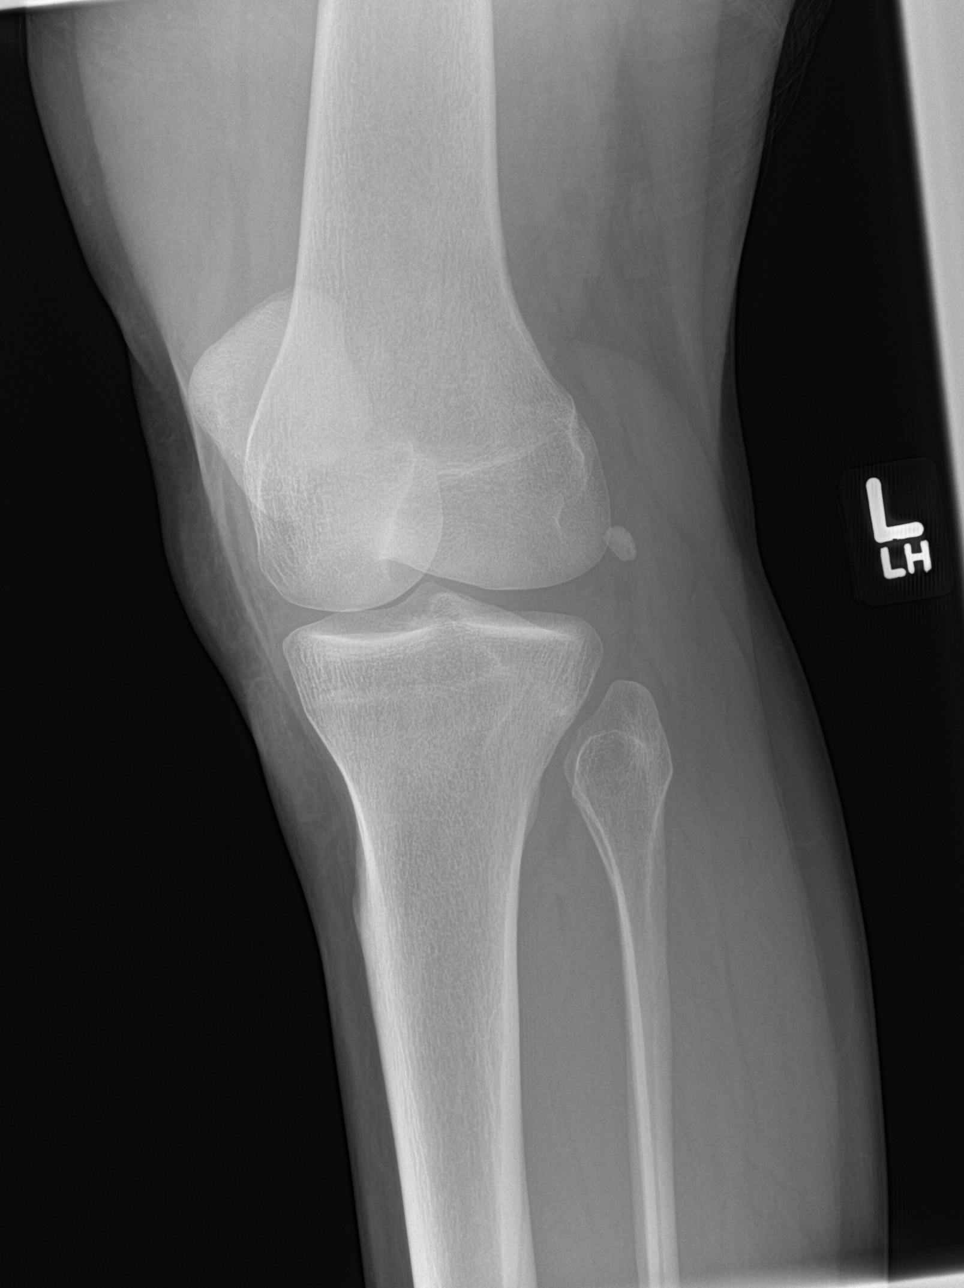

[4 of 4 positions shown; findings below may reference images not displayed]

FINDINGS: No evidence of fracture, dislocation, or joint effusion. No evidence
of arthropathy or other focal bone abnormality. Soft tissues are
unremarkable.
IMPRESSION: Negative.

## 2023-01-02 ENCOUNTER — Other Ambulatory Visit: Payer: Self-pay

## 2023-01-02 ENCOUNTER — Emergency Department
Admission: EM | Admit: 2023-01-02 | Discharge: 2023-01-02 | Disposition: A | Discharge status: Home / Self Care | Payer: Medicaid Other | Attending: Emergency Medicine | Admitting: Emergency Medicine

## 2023-01-02 ENCOUNTER — Emergency Department: Payer: Medicaid Other

## 2023-01-02 DIAGNOSIS — Z716 Tobacco abuse counseling: Secondary | ICD-10-CM | POA: Diagnosis not present

## 2023-01-02 DIAGNOSIS — Z8673 Personal history of transient ischemic attack (TIA), and cerebral infarction without residual deficits: Secondary | ICD-10-CM | POA: Insufficient documentation

## 2023-01-02 DIAGNOSIS — R42 Dizziness and giddiness: Secondary | ICD-10-CM | POA: Diagnosis not present

## 2023-01-02 DIAGNOSIS — Z823 Family history of stroke: Secondary | ICD-10-CM | POA: Diagnosis not present

## 2023-01-02 DIAGNOSIS — R29818 Other symptoms and signs involving the nervous system: Secondary | ICD-10-CM | POA: Diagnosis not present

## 2023-01-02 DIAGNOSIS — U071 COVID-19: Secondary | ICD-10-CM | POA: Diagnosis not present

## 2023-01-02 DIAGNOSIS — R059 Cough, unspecified: Secondary | ICD-10-CM | POA: Diagnosis present

## 2023-01-02 LAB — CBC
HCT: 45.8 % (ref 39.0–52.0)
Hemoglobin: 15.7 g/dL (ref 13.0–17.0)
MCH: 29.6 pg (ref 26.0–34.0)
MCHC: 34.3 g/dL (ref 30.0–36.0)
MCV: 86.3 fL (ref 80.0–100.0)
Platelets: 169 10*3/uL (ref 150–400)
RBC: 5.31 MIL/uL (ref 4.22–5.81)
RDW: 12.9 % (ref 11.5–15.5)
WBC: 4.8 10*3/uL (ref 4.0–10.5)
nRBC: 0 % (ref 0.0–0.2)

## 2023-01-02 LAB — BASIC METABOLIC PANEL
Anion gap: 9 (ref 5–15)
BUN: 9 mg/dL (ref 6–20)
CO2: 21 mmol/L — ABNORMAL LOW (ref 22–32)
Calcium: 8.6 mg/dL — ABNORMAL LOW (ref 8.9–10.3)
Chloride: 104 mmol/L (ref 98–111)
Creatinine, Ser: 0.99 mg/dL (ref 0.61–1.24)
GFR, Estimated: >60 mL/min (ref >60)
Glucose, Bld: 124 mg/dL — ABNORMAL HIGH (ref 70–99)
Potassium: 3.4 mmol/L — ABNORMAL LOW (ref 3.5–5.1)
Sodium: 134 mmol/L — ABNORMAL LOW (ref 135–145)

## 2023-01-02 LAB — SARS CORONAVIRUS 2 BY RT PCR: SARS Coronavirus 2 by RT PCR: POSITIVE — AB

## 2023-01-02 MED ORDER — MECLIZINE HCL 25 MG PO TABS: 25.0000 mg | ORAL_TABLET | Freq: Three times a day (TID) | ORAL | 0 refills | Status: AC | PRN

## 2023-01-02 MED ORDER — NICOTINE 14 MG/24HR TD PT24: 14.0000 mg | MEDICATED_PATCH | TRANSDERMAL | 0 refills | Status: AC

## 2023-01-02 MED ORDER — IBUPROFEN 600 MG PO TABS
600.0000 mg | ORAL_TABLET | Freq: Once | ORAL | Status: AC
  Administered 2023-01-02: 600 mg via ORAL
  Filled 2023-01-02: qty 1

## 2023-01-02 MED ORDER — NICOTINE POLACRILEX 4 MG MT LOZG: 4.0000 mg | LOZENGE | OROMUCOSAL | 0 refills | Status: AC | PRN

## 2023-01-02 MED ORDER — MECLIZINE HCL 25 MG PO TABS
25.0000 mg | ORAL_TABLET | Freq: Once | ORAL | Status: AC
  Administered 2023-01-02: 25 mg via ORAL
  Filled 2023-01-02: qty 1

## 2023-01-02 NOTE — ED Provider Notes (Signed)
[facility] Provider Note    Event Date/Time   First MD Initiated Contact with Patient 01/02/23 1241     (approximate)   History   Dizziness   HPI  Joseph Beard is a 37 y.o. 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   He had a known COVID exposure last week and has developed 3 days of upper pressure infectious symptoms including cough and congestion and myalgias.  Since then he has also noted vertiginous symptoms dizziness upon standing with head movements.  He is concerned given his strong family history of stroke.  He denies chest pain, shortness of breath, fevers or GI GU complaints.  Independent Historian contributed to assessment above: His partner is at bedside to corroborate information given above       Physical Exam   Triage Vital Signs: ED Triage Vitals  Encounter Vitals Group     BP 01/02/23 1159 136/79     Systolic BP Percentile --      Diastolic BP Percentile --      Pulse Rate 01/02/23 1159 86     Resp 01/02/23 1159 16     Temp 01/02/23 1159 98.8 F (37.1 C)     Temp src --      SpO2 01/02/23 1159 97 %     Weight 01/02/23 1158 230 lb (104.3 kg)     Height 01/02/23 1158 5\' 11"  (1.803 m)     Head Circumference --      Peak Flow --      Pain Score 01/02/23 1158 10     Pain Loc --      Pain Education --      Exclude from Growth Chart --     Most recent vital signs: Vitals:   01/02/23 1159  BP: 136/79  Pulse: 86  Resp: 16  Temp: 98.8 F (37.1 C)  SpO2: 97%    General: Awake, no distress.  CV:  Good peripheral perfusion.  Resp:  Normal effort.  Abd:  No distention.  Other:  Awake alert comfortable appearing no acute distress normal vital signs.  Finger-nose is normal as is motor or sensory exam, gait is stable but he states that when he stands he feels markedly lightheadedness and the  room spinning.  He has right beating nystagmus.   ED Results / Procedures / Treatments   Labs (all labs ordered are listed, but only abnormal results are displayed) Labs Reviewed  SARS CORONAVIRUS 2 BY RT PCR - Abnormal; Notable for the following components:      Result Value   SARS Coronavirus 2 by RT PCR POSITIVE (*)    All other components within normal limits  BASIC METABOLIC PANEL - Abnormal; Notable for the following components:   Sodium 134 (*)    Potassium 3.4 (*)    CO2 21 (*)    Glucose, Bld 124 (*)    Calcium 8.6 (*)    All other components within normal limits  CBC     I ordered and reviewed the above labs they are notable for he is COVID-positive.  EKG  ED ECG REPORT I, Pilar Jarvis, the attending physician, personally viewed and interpreted this ECG.   Date: 01/02/2023  EKG Time: 1203  Rate: 81  Rhythm: nsr  Axis: nl  Intervals:none  ST&T Change: no stemi  RADIOLOGY I independently reviewed and interpreted chest x-ray and I see no obvious focality or pneumothorax I also reviewed radiologist's formal read.   PROCEDURES:  Critical Care performed: No  Procedures   MEDICATIONS ORDERED IN ED: Medications  meclizine (ANTIVERT) tablet 25 mg (25 mg Oral Given 01/02/23 1325)  ibuprofen (ADVIL) tablet 600 mg (600 mg Oral Given 01/02/23 1325)     IMPRESSION / MDM / ASSESSMENT AND PLAN / ED COURSE  I reviewed the triage vital signs and the nursing notes.                                Patient's presentation is most consistent with acute presentation with potential threat to life or bodily function.  Differential diagnosis includes, but is not limited to, COVID infection, viral URI, labyrinthitis, peripheral vertigo like BPPV, stroke   The patient is on the cardiac monitor to evaluate for evidence of arrhythmia and/or significant heart rate changes.  MDM: Patient with a strong family history of stroke and TIA self-reported with vertigo more  likely attributable to his COVID-positive upper respiratory infectious symptoms .  However given strong history and concern for stroke MRI ordered which fortunately is negative.  He is COVID-positive.  His other labs look normal and his vital signs are normal he looks stable nontoxic.  Plan for discharge.     --  I spent 5 minutes counseling this patient on smoking cessation.  We spoke about the patient's current tobacco use, impact of smoking, assessed willingness to quit, methods for cessation including medical management and nicotine replacement therapy (which I prescribed to the patient) and advised follow-up with primary doctor to continue to address smoking cessation.        FINAL CLINICAL IMPRESSION(S) / ED DIAGNOSES   Final diagnoses:  Lightheadedness  COVID  Encounter for smoking cessation counseling     Rx / DC Orders   ED Discharge Orders          Ordered    meclizine (ANTIVERT) 25 MG tablet  3 times daily PRN        01/02/23 1446    Ambulatory Referral to Primary Care (Establish Care)        01/02/23 1446    nicotine polacrilex (NICOTINE MINI) 4 MG lozenge  As needed        01/02/23 1446    nicotine (NICODERM CQ - DOSED IN MG/24 HOURS) 14 mg/24hr patch  Every 24 hours        01/02/23 1446             Note:  This document was prepared using Dragon voice recognition software and may include unintentional dictation errors.    Pilar Jarvis, MD 01/02/23 [phone]

## 2023-01-02 NOTE — Discharge Instructions (Signed)
MRI was negative for stroke.  I think your symptoms are related to your COVID infection.  Drink plenty of fluids to stay well-hydrated.  Find Pedialyte or similar electrolyte rehydration formulas at your local pharmacy. Thank you for choosing [place] for your health care today!  Please see your primary doctor this week for a follow up appointment.   If you have any new, worsening, or unexpected symptoms call your doctor right away or come back to the emergency department for reevaluation.  It was my pleasure to care for you today.   Daneil Dan [place] Charon, MD

## 2023-01-02 NOTE — ED Triage Notes (Signed)
Pt to ED for positional dizziness for 3 days. 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 Reports around someone with covid. +h/a

## 2023-02-09 ENCOUNTER — Emergency Department
Admission: EM | Admit: 2023-02-09 | Discharge: 2023-02-09 | Disposition: A | Discharge status: Home / Self Care | Payer: Medicaid Other | Attending: Emergency Medicine | Admitting: Emergency Medicine

## 2023-02-09 ENCOUNTER — Other Ambulatory Visit: Payer: Self-pay

## 2023-02-09 ENCOUNTER — Encounter: Payer: Self-pay | Admitting: Emergency Medicine

## 2023-02-09 DIAGNOSIS — B309 Viral conjunctivitis, unspecified: Secondary | ICD-10-CM | POA: Diagnosis not present

## 2023-02-09 DIAGNOSIS — H1032 Unspecified acute conjunctivitis, left eye: Secondary | ICD-10-CM | POA: Diagnosis not present

## 2023-02-09 DIAGNOSIS — H5789 Other specified disorders of eye and adnexa: Secondary | ICD-10-CM | POA: Diagnosis present

## 2023-02-09 MED ORDER — ERYTHROMYCIN 5 MG/GM OP OINT: 1.0000 | TOPICAL_OINTMENT | Freq: Two times a day (BID) | OPHTHALMIC | 0 refills | Status: AC

## 2023-02-09 NOTE — ED Provider Notes (Signed)
   [facility] Provider Note    Event Date/Time   First MD Initiated Contact with Patient 02/09/23 [phone]     (approximate)   History   Eye Problem   HPI  ADITH GLENDENNING is a 37 y.o. male with no significant past medical history who complains of left eye redness swelling and discomfort that started this morning when waking up.  Yesterday afternoon he noticed that he was starting to get itching and watering in the eye.  No headache fever neck pain.  No trauma.  No sore throat or cough     Physical Exam   Triage Vital Signs: ED Triage Vitals  Encounter Vitals Group     BP 02/09/23 0810 126/70     Systolic BP Percentile --      Diastolic BP Percentile --      Pulse Rate 02/09/23 0810 78     Resp 02/09/23 0810 16     Temp 02/09/23 0810 98.3 F (36.8 C)     Temp Source 02/09/23 0810 Oral     SpO2 02/09/23 0810 97 %     Weight 02/09/23 0806 229 lb 15 oz (104.3 kg)     Height 02/09/23 0806 5\' 11"  (1.803 m)     Head Circumference --      Peak Flow --      Pain Score 02/09/23 0805 0     Pain Loc --      Pain Education --      Exclude from Growth Chart --     Most recent vital signs: Vitals:   02/09/23 0810  BP: 126/70  Pulse: 78  Resp: 16  Temp: 98.3 F (36.8 C)  SpO2: 97%    General: Awake, no distress.  CV:  Good peripheral perfusion.  Resp:  Normal effort.  Abd:  No distention.  Other:  Left eye with soft edema of the upper eyelid.  The eye is watery with diffuse hazy erythema consistent with viral conjunctivitis.  No foreign body.  Lids everted.  No hypopyon or ciliary flush.  Globe is nontender   ED Results / Procedures / Treatments   Labs (all labs ordered are listed, but only abnormal results are displayed) Labs Reviewed - No data to display   RADIOLOGY    PROCEDURES:  Procedures   MEDICATIONS ORDERED IN ED: Medications - No data to display   IMPRESSION / MDM / ASSESSMENT AND PLAN / ED COURSE  I reviewed the triage  vital signs and the nursing notes.                             Patient presents with clinically apparent conjunctivitis, highly suspicious for viral or allergic.  Doubt bacterial.  Doubt cellulitis, including orbital cellulitis. Doubt glaucoma.       FINAL CLINICAL IMPRESSION(S) / ED DIAGNOSES   Final diagnoses:  Acute viral conjunctivitis of left eye     Rx / DC Orders   ED Discharge Orders          Ordered    erythromycin ophthalmic ointment  2 times daily        02/09/23 5573             Note:  This document was prepared using Dragon voice recognition software and may include unintentional dictation errors.   Sharman Cheek, MD 02/09/23 [phone]

## 2023-02-09 NOTE — ED Triage Notes (Signed)
Let eye pain and swelling.  Woke up this way this morning.

## 2023-02-09 NOTE — ED Notes (Signed)
See triage note  Presents with left eye pain and swelling this am   Denies any injury

## 2023-02-09 NOTE — ED Notes (Signed)
Patient vision acuity done. Right eye 20/50, Left eye 20/30.

## 2023-03-27 NOTE — Progress Notes (Deleted)
   There were no vitals taken for this visit.   Subjective:    Patient ID: Joseph Beard, male    DOB: 1986-02-01, 37 y.o.   MRN: [id number]  HPI: Joseph Beard is a 37 y.o. male  No chief complaint on file.  Patient presents to clinic to establish care with new PCP.  Introduced to Publishing rights manager role and practice setting.  All questions answered.  Discussed provider/patient relationship and expectations.  Patient reports a history of ***. Patient denies a history of: Hypertension, Elevated Cholesterol, Diabetes, Thyroid problems, Depression, Anxiety, Neurological problems, and Abdominal problems.   Active Ambulatory Problems    Diagnosis Date Noted   No Active Ambulatory Problems   Resolved Ambulatory Problems    Diagnosis Date Noted   No Resolved Ambulatory Problems   No Additional Past Medical History   Past Surgical History:  Procedure Laterality Date   club foot surgery     No family history on file.   Review of Systems  Per HPI unless specifically indicated above     Objective:    There were no vitals taken for this visit.  Wt Readings from Last 3 Encounters:  02/09/23 229 lb 15 oz (104.3 kg)  01/02/23 230 lb (104.3 kg)  03/25/22 203 lb (92.1 kg)    Physical Exam  Results for orders placed or performed during the hospital encounter of 01/02/23  SARS Coronavirus 2 by RT PCR (hospital order, performed in [facility] hospital lab) *cepheid single result test* Anterior Nasal Swab   Specimen: Anterior Nasal Swab  Result Value Ref Range   SARS Coronavirus 2 by RT PCR POSITIVE (A) NEGATIVE  CBC  Result Value Ref Range   WBC 4.8 4.0 - 10.5 K/uL   RBC 5.31 4.22 - 5.81 MIL/uL   Hemoglobin 15.7 13.0 - 17.0 g/dL   HCT 40.9 81.1 - 91.4 %   MCV 86.3 80.0 - 100.0 fL   MCH 29.6 26.0 - 34.0 pg   MCHC 34.3 30.0 - 36.0 g/dL   RDW 78.2 95.6 - 21.3 %   Platelets 169 150 - 400 K/uL   nRBC 0.0 0.0 - 0.2 %  Basic metabolic panel  Result Value Ref Range   Sodium  134 (L) 135 - 145 mmol/L   Potassium 3.4 (L) 3.5 - 5.1 mmol/L   Chloride 104 98 - 111 mmol/L   CO2 21 (L) 22 - 32 mmol/L   Glucose, Bld 124 (H) 70 - 99 mg/dL   BUN 9 6 - 20 mg/dL   Creatinine, Ser 0.86 0.61 - 1.24 mg/dL   Calcium 8.6 (L) 8.9 - 10.3 mg/dL   GFR, Estimated >57 >84 mL/min   Anion gap 9 5 - 15      Assessment & Plan:   Problem List Items Addressed This Visit   None    Follow up plan: No follow-ups on file.

## 2023-03-28 ENCOUNTER — Ambulatory Visit: Payer: Medicaid Other | Admitting: Nurse Practitioner

## 2023-05-29 DIAGNOSIS — M7751 Other enthesopathy of right foot: Secondary | ICD-10-CM | POA: Diagnosis not present

## 2023-08-13 ENCOUNTER — Ambulatory Visit: Payer: Medicaid Other | Admitting: Nurse Practitioner

## 2023-08-13 NOTE — Progress Notes (Deleted)
   There were no vitals taken for this visit.   Subjective:    Patient ID: Joseph Beard, male    DOB: 01/23/1986, 38 y.o.   MRN: [id number]  HPI: Joseph Beard is a 38 y.o. male  No chief complaint on file.  Patient presents to clinic to establish care with new PCP.  Introduced to Publishing rights manager role and practice setting.  All questions answered.  Discussed provider/patient relationship and expectations.  Patient reports a history of ***. Patient denies a history of: Hypertension, Elevated Cholesterol, Diabetes, Thyroid problems, Depression, Anxiety, Neurological problems, and Abdominal problems.   Active Ambulatory Problems    Diagnosis Date Noted   No Active Ambulatory Problems   Resolved Ambulatory Problems    Diagnosis Date Noted   No Resolved Ambulatory Problems   No Additional Past Medical History   Past Surgical History:  Procedure Laterality Date   club foot surgery     No family history on file.   Review of Systems  Per HPI unless specifically indicated above     Objective:    There were no vitals taken for this visit.  Wt Readings from Last 3 Encounters:  02/09/23 229 lb 15 oz (104.3 kg)  01/02/23 230 lb (104.3 kg)  03/25/22 203 lb (92.1 kg)    Physical Exam  Results for orders placed or performed during the hospital encounter of 01/02/23  CBC   Collection Time: 01/02/23 12:00 PM  Result Value Ref Range   WBC 4.8 4.0 - 10.5 K/uL   RBC 5.31 4.22 - 5.81 MIL/uL   Hemoglobin 15.7 13.0 - 17.0 g/dL   HCT 40.9 81.1 - 91.4 %   MCV 86.3 80.0 - 100.0 fL   MCH 29.6 26.0 - 34.0 pg   MCHC 34.3 30.0 - 36.0 g/dL   RDW 78.2 95.6 - 21.3 %   Platelets 169 150 - 400 K/uL   nRBC 0.0 0.0 - 0.2 %  Basic metabolic panel   Collection Time: 01/02/23 12:00 PM  Result Value Ref Range   Sodium 134 (L) 135 - 145 mmol/L   Potassium 3.4 (L) 3.5 - 5.1 mmol/L   Chloride 104 98 - 111 mmol/L   CO2 21 (L) 22 - 32 mmol/L   Glucose, Bld 124 (H) 70 - 99 mg/dL   BUN 9 6 -  20 mg/dL   Creatinine, Ser 0.86 0.61 - 1.24 mg/dL   Calcium 8.6 (L) 8.9 - 10.3 mg/dL   GFR, Estimated >57 >84 mL/min   Anion gap 9 5 - 15  SARS Coronavirus 2 by RT PCR (hospital order, performed in [facility] hospital lab) *cepheid single result test* Anterior Nasal Swab   Collection Time: 01/02/23 12:48 PM   Specimen: Anterior Nasal Swab  Result Value Ref Range   SARS Coronavirus 2 by RT PCR POSITIVE (A) NEGATIVE      Assessment & Plan:   Problem List Items Addressed This Visit   None    Follow up plan: No follow-ups on file.

## 2023-08-26 ENCOUNTER — Emergency Department

## 2023-08-26 ENCOUNTER — Other Ambulatory Visit: Payer: Self-pay

## 2023-08-26 ENCOUNTER — Emergency Department
Admission: EM | Admit: 2023-08-26 | Discharge: 2023-08-26 | Disposition: A | Discharge status: Home / Self Care | Attending: Emergency Medicine | Admitting: Emergency Medicine

## 2023-08-26 DIAGNOSIS — R55 Syncope and collapse: Secondary | ICD-10-CM | POA: Diagnosis not present

## 2023-08-26 DIAGNOSIS — R079 Chest pain, unspecified: Secondary | ICD-10-CM | POA: Diagnosis not present

## 2023-08-26 DIAGNOSIS — J101 Influenza due to other identified influenza virus with other respiratory manifestations: Secondary | ICD-10-CM | POA: Diagnosis not present

## 2023-08-26 DIAGNOSIS — R918 Other nonspecific abnormal finding of lung field: Secondary | ICD-10-CM | POA: Diagnosis not present

## 2023-08-26 LAB — CBC
HCT: 48.2 % (ref 39.0–52.0)
Hemoglobin: 16.7 g/dL (ref 13.0–17.0)
MCH: 29.6 pg (ref 26.0–34.0)
MCHC: 34.6 g/dL (ref 30.0–36.0)
MCV: 85.3 fL (ref 80.0–100.0)
Platelets: 232 10*3/uL (ref 150–400)
RBC: 5.65 MIL/uL (ref 4.22–5.81)
RDW: 13.5 % (ref 11.5–15.5)
WBC: 7 10*3/uL (ref 4.0–10.5)
nRBC: 0 % (ref 0.0–0.2)

## 2023-08-26 LAB — RESP PANEL BY RT-PCR (RSV, FLU A&B, COVID)  RVPGX2
Influenza A by PCR: POSITIVE — AB
Influenza B by PCR: NEGATIVE
Resp Syncytial Virus by PCR: NEGATIVE
SARS Coronavirus 2 by RT PCR: NEGATIVE

## 2023-08-26 LAB — BASIC METABOLIC PANEL
Anion gap: 8 (ref 5–15)
BUN: 6 mg/dL (ref 6–20)
CO2: 21 mmol/L — ABNORMAL LOW (ref 22–32)
Calcium: 8.8 mg/dL — ABNORMAL LOW (ref 8.9–10.3)
Chloride: 109 mmol/L (ref 98–111)
Creatinine, Ser: 0.61 mg/dL (ref 0.61–1.24)
GFR, Estimated: >60 mL/min (ref >60)
Glucose, Bld: 108 mg/dL — ABNORMAL HIGH (ref 70–99)
Potassium: 3.6 mmol/L (ref 3.5–5.1)
Sodium: 138 mmol/L (ref 135–145)

## 2023-08-26 LAB — TROPONIN I (HIGH SENSITIVITY): Troponin I (High Sensitivity): <2 ng/L (ref <18)

## 2023-08-26 MED ORDER — SODIUM CHLORIDE 0.9 % IV BOLUS
1000.0000 mL | Freq: Once | INTRAVENOUS | Status: AC
  Administered 2023-08-26: 1000 mL via INTRAVENOUS

## 2023-08-26 MED ORDER — IOHEXOL 350 MG/ML SOLN
75.0000 mL | Freq: Once | INTRAVENOUS | Status: AC | PRN
  Administered 2023-08-26: 75 mL via INTRAVENOUS

## 2023-08-26 MED ORDER — ALBUTEROL SULFATE HFA 108 (90 BASE) MCG/ACT IN AERS: 2.0000 | INHALATION_SPRAY | Freq: Four times a day (QID) | RESPIRATORY_TRACT | 2 refills | Status: DC | PRN

## 2023-08-26 MED ORDER — ONDANSETRON HCL 4 MG/2ML IJ SOLN
4.0000 mg | Freq: Once | INTRAMUSCULAR | Status: DC
  Filled 2023-08-26: qty 2

## 2023-08-26 MED ORDER — ONDANSETRON 4 MG PO TBDP: 4.0000 mg | ORAL_TABLET | Freq: Three times a day (TID) | ORAL | 0 refills | Status: AC | PRN

## 2023-08-26 MED ORDER — ACETAMINOPHEN 500 MG PO TABS
1000.0000 mg | ORAL_TABLET | Freq: Once | ORAL | Status: AC
  Administered 2023-08-26: 1000 mg via ORAL
  Filled 2023-08-26: qty 2

## 2023-08-26 NOTE — Discharge Instructions (Signed)
 Please drink plenty of fluids, use Tylenol or ibuprofen every 6 hours as needed for fever/discomfort.  Obtain plenty of rest.  Return to the emergency department for any trouble breathing or any other symptom personally concerning to yourself or family member.

## 2023-08-26 NOTE — ED Provider Notes (Signed)
 [facility] Provider Note    Event Date/Time   First MD Initiated Contact with Patient 08/26/23 1127     (approximate)  History   Chief Complaint: Chest Pain, Dizziness, Chills, and Loss of Consciousness  HPI  Joseph Beard is a 38 y.o. male with no significant past medical history who presents to the emergency department after multiple syncopal episodes this morning.  According to the patient for the last few days he has been feeling weak and has been experiencing diarrhea.  States 1 episode of vomiting this morning.  Was feeling very lightheaded try to take a shower and had a syncopal episode went back to his room had additional syncopal episode.  Patient has noted a runny nose for the past few days as well as a mild cough.  No known fever.  Patient denies any history of syncope previously.  Upon arrival patient has a room air saturation of 89% with no O2 requirement at baseline and no lung disorders at baseline.  Patient does state moderate chest discomfort described more as a tightness sensation although somewhat worse if he coughs or takes a deep breath.  No history of blood clot.  Physical Exam   Triage Vital Signs: ED Triage Vitals  Encounter Vitals Group     BP 08/26/23 1047 115/79     Systolic BP Percentile --      Diastolic BP Percentile --      Pulse Rate 08/26/23 1047 88     Resp 08/26/23 1047 20     Temp 08/26/23 1047 99.1 F (37.3 C)     Temp Source 08/26/23 1047 Oral     SpO2 08/26/23 1047 (!) 89 %     Weight 08/26/23 1046 215 lb (97.5 kg)     Height 08/26/23 1046 5\' 7"  (1.702 m)     Head Circumference --      Peak Flow --      Pain Score 08/26/23 1048 8     Pain Loc --      Pain Education --      Exclude from Growth Chart --     Most recent vital signs: Vitals:   08/26/23 1050 08/26/23 1200  BP:  129/76  Pulse:  84  Resp:  14  Temp:    SpO2: 94%     General: Awake, no distress.  Mild rhinorrhea. CV:  Good peripheral  perfusion.  Regular rate and rhythm  Resp:  Normal effort.  Equal breath sounds bilaterally.  Abd:  No distention.  Soft, nontender.  No rebound or guarding.  ED Results / Procedures / Treatments   EKG  EKG viewed and interpreted by myself shows a normal sinus rhythm 89 bpm with a narrow QRS, normal axis, normal intervals, no concerning ST changes.  RADIOLOGY  I have reviewed and interpreted chest x-ray images.  No consolidation on my evaluation. Radiology has read the x-ray is negative   MEDICATIONS ORDERED IN ED: Medications  ondansetron (ZOFRAN) injection 4 mg (0 mg Intravenous Hold 08/26/23 1159)  sodium chloride 0.9 % bolus 1,000 mL (1,000 mLs Intravenous New Bag/Given 08/26/23 1159)  sodium chloride 0.9 % bolus 1,000 mL (1,000 mLs Intravenous New Bag/Given 08/26/23 1159)     IMPRESSION / MDM / ASSESSMENT AND PLAN / ED COURSE  I reviewed the triage vital signs and the nursing notes.  Patient's presentation is most consistent with acute presentation with potential threat to life or bodily function.  Patient presents to the  emergency department for shortness of breath syncopal events and chest pain.  Upon arrival patient satting 89%placed on 2 L nasal cannula patient satting in the upper 90s 90s.  Patient states central chest pain, worse with inspiration or cough.  States several days of cough diarrhea and now runny nose as well.  Patient had multiple syncopal episodes this morning with no history of syncope previously.  Patient's lab work shows a reassuring CBC, reassuring chemistry with a anion gap of 8.  Troponin reassuringly.  Patient's respiratory panel has resulted positive for influenza A which is likely the culprit for the patient's symptoms however given the patient's hypoxia with syncope we will obtain a CT of the chest to evaluate for pulmonary embolism.  I have reviewed the CT images I do not see any large pulmonary embolism.  Awaiting the read.  Patient receiving IV  fluids.  CT scan read as negative besides some bronchial wall thickening.  Highly suspect influenza A to be the cause of the patient's symptoms.  We have taken the patient off of oxygen maintaining sats 97 to 98%.  Patient has received 2 L of fluids and is feeling much better.  Patient did spike a low-grade temperature 100.4 we will dose Tylenol.  I discussed with the patient continued supportive care at home including plenty of fluids, Tylenol or ibuprofen every 6 hours and rest.  Patient agreeable to plan of care.  FINAL CLINICAL IMPRESSION(S) / ED DIAGNOSES   Influenza A Syncope  Note:  This document was prepared using Dragon voice recognition software and may include unintentional dictation errors.   Minna Antis, MD 08/26/23 1416

## 2023-08-26 NOTE — ED Triage Notes (Signed)
 Pt to ED with partner  for chest heaviness since yesterday morning and had syncopal episode this morning in shower this AM. Pt started with cough yesterday also. After syncope this AM, then laid down in dark room and felt dizzy in there too "like the whole room was spinning". Also having chills. Donated plasma last week. Has runny nose.   89% on RA, placed on 2L.

## 2024-04-19 ENCOUNTER — Emergency Department

## 2024-04-19 ENCOUNTER — Emergency Department: Admission: EM | Admit: 2024-04-19 | Discharge: 2024-04-19 | Disposition: A | Discharge status: Home / Self Care

## 2024-04-19 ENCOUNTER — Encounter: Payer: Self-pay | Admitting: Emergency Medicine

## 2024-04-19 ENCOUNTER — Other Ambulatory Visit: Payer: Self-pay

## 2024-04-19 DIAGNOSIS — R0789 Other chest pain: Secondary | ICD-10-CM | POA: Diagnosis not present

## 2024-04-19 DIAGNOSIS — R42 Dizziness and giddiness: Secondary | ICD-10-CM | POA: Diagnosis not present

## 2024-04-19 DIAGNOSIS — Z8673 Personal history of transient ischemic attack (TIA), and cerebral infarction without residual deficits: Secondary | ICD-10-CM | POA: Insufficient documentation

## 2024-04-19 DIAGNOSIS — R0602 Shortness of breath: Secondary | ICD-10-CM | POA: Diagnosis not present

## 2024-04-19 DIAGNOSIS — J9801 Acute bronchospasm: Secondary | ICD-10-CM | POA: Diagnosis not present

## 2024-04-19 LAB — BASIC METABOLIC PANEL WITH GFR
Anion gap: 10 (ref 5–15)
BUN: 10 mg/dL (ref 6–20)
CO2: 26 mmol/L (ref 22–32)
Calcium: 8.6 mg/dL — ABNORMAL LOW (ref 8.9–10.3)
Chloride: 104 mmol/L (ref 98–111)
Creatinine, Ser: 0.99 mg/dL (ref 0.61–1.24)
GFR, Estimated: >60 mL/min (ref >60)
Glucose, Bld: 91 mg/dL (ref 70–99)
Potassium: 4 mmol/L (ref 3.5–5.1)
Sodium: 140 mmol/L (ref 135–145)

## 2024-04-19 LAB — CBC
HCT: 46.5 % (ref 39.0–52.0)
Hemoglobin: 15.9 g/dL (ref 13.0–17.0)
MCH: 29.8 pg (ref 26.0–34.0)
MCHC: 34.2 g/dL (ref 30.0–36.0)
MCV: 87.1 fL (ref 80.0–100.0)
Platelets: 246 K/uL (ref 150–400)
RBC: 5.34 MIL/uL (ref 4.22–5.81)
RDW: 13.5 % (ref 11.5–15.5)
WBC: 10.1 K/uL (ref 4.0–10.5)
nRBC: 0 % (ref 0.0–0.2)

## 2024-04-19 LAB — TROPONIN I (HIGH SENSITIVITY): Troponin I (High Sensitivity): <2 ng/L (ref <18)

## 2024-04-19 LAB — SARS CORONAVIRUS 2 BY RT PCR: SARS Coronavirus 2 by RT PCR: NEGATIVE

## 2024-04-19 LAB — GROUP A STREP BY PCR: Group A Strep by PCR: NOT DETECTED

## 2024-04-19 MED ORDER — PREDNISONE 20 MG PO TABS
60.0000 mg | ORAL_TABLET | Freq: Once | ORAL | Status: AC
  Administered 2024-04-19: 60 mg via ORAL
  Filled 2024-04-19: qty 3

## 2024-04-19 MED ORDER — PREDNISONE 20 MG PO TABS: 20.0000 mg | ORAL_TABLET | Freq: Every day | ORAL | 0 refills | Status: DC

## 2024-04-19 MED ORDER — IPRATROPIUM-ALBUTEROL 0.5-2.5 (3) MG/3ML IN SOLN
3.0000 mL | Freq: Once | RESPIRATORY_TRACT | Status: AC
  Administered 2024-04-19: 3 mL via RESPIRATORY_TRACT
  Filled 2024-04-19: qty 3

## 2024-04-19 MED ORDER — ALBUTEROL SULFATE HFA 108 (90 BASE) MCG/ACT IN AERS: 1.0000 | INHALATION_SPRAY | RESPIRATORY_TRACT | 4 refills | Status: AC | PRN

## 2024-04-19 MED ORDER — PREDNISONE 20 MG PO TABS: 20.0000 mg | ORAL_TABLET | Freq: Two times a day (BID) | ORAL | 0 refills | Status: AC

## 2024-04-19 NOTE — Discharge Instructions (Addendum)
 For the next 24 hours, give yourself a puff of your albuterol  inhaler every 4 hours while awake.  Your next dose of steroids will be due tomorrow morning.  Follow-up with your primary care physician (I placed a referral for you).  Return with any acutely worsening symptoms.  Is very nice meeting you and I wish you the best of luck. -- RETURN PRECAUTIONS & AFTERCARE: (ENGLISH) RETURN PRECAUTIONS: Return immediately to the emergency department or see/call your doctor if you feel worse, weak or have changes in speech or vision, are short of breath, have fever, vomiting, pain, bleeding or dark stool, trouble urinating or any new issues. Return here or see/call your doctor if not improving as expected for your suspected condition. FOLLOW-UP CARE: Call your doctor and/or any doctors we referred you to for more advice and to make an appointment. Do this today, tomorrow or after the weekend. Some doctors only take PPO insurance so if you have HMO insurance you may want to contact your HMO or your regular doctor for referral to a specialist within your plan. Either way tell the doctor's office that it was a referral from the emergency department so you get the soonest possible appointment.  YOUR TEST RESULTS: Take result reports of any blood or urine tests, imaging tests and EKG's to your doctor and any referral doctor. Have any abnormal tests repeated. Your doctor or a referral doctor can let you know when this should be done. Also make sure your doctor contacts this hospital to get any test results that are not currently available such as cultures or special tests for infection and final imaging reports, which are often not available at the time you leave the ER but which may list additional important findings that are not documented on the preliminary report. BLOOD PRESSURE: If your blood pressure was greater than 120/80 have your blood pressure rechecked within 1 to 2 weeks. MEDICATION SIDE EFFECTS: Do not drive,  walk, bike, take the bus, etc. if you have received or are being prescribed any sedating medications such as those for pain or anxiety or certain antihistamines like Benadryl. If you have been give one of these here get a taxi home or have a friend drive you home. Ask your pharmacist to counsel you on potential side effects of any new medication

## 2024-04-19 NOTE — ED Provider Notes (Signed)
 [facility] Provider Note    Event Date/Time   First MD Initiated Contact with Patient 04/19/24 1632     (approximate)   History   Dizziness and Shortness of Breath   HPI  Joseph Beard is a 38 y.o. 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  Patient reports that he feels as if he cannot take a deep breath in.  He reports multiple illnesses in his household including small children who have been diagnosed with upper respiratory infections and strep.  Denies any fevers or abdominal pain or changes in urinary or bowel habits      Physical Exam   Triage Vital Signs: ED Triage Vitals  Encounter Vitals Group     BP 04/19/24 1608 122/81     Girls Systolic BP Percentile --      Girls Diastolic BP Percentile --      Boys Systolic BP Percentile --      Boys Diastolic BP Percentile --      Pulse Rate 04/19/24 1608 71     Resp 04/19/24 1608 18     Temp 04/19/24 1608 98 F (36.7 C)     Temp Source 04/19/24 1608 Oral     SpO2 04/19/24 1608 100 %     Weight 04/19/24 1608 203 lb (92.1 kg)     Height 04/19/24 1608 5' 11 (1.803 m)     Head Circumference --      Peak Flow --      Pain Score 04/19/24 1607 5     Pain Loc --      Pain Education --      Exclude from Growth Chart --     Most recent vital signs: Vitals:   04/19/24 1608 04/19/24 1716  BP: 122/81 106/69  Pulse: 71 65  Resp: 18 16  Temp: 98 F (36.7 C)   SpO2: 100% 100%    Nursing Triage Note reviewed. Vital signs reviewed and patients oxygen saturation is normoxic  General: Patient is well nourished, well developed, awake and alert, resting comfortably in no acute distress Head: Normocephalic and atraumatic Eyes: Normal inspection, extraocular muscles intact, no conjunctival pallor Ear, nose, throat: Normal  external exam Tonsils are 2+ and erythematous without exudates Neck: Normal range of motion Respiratory: Patient is in mild respiratory distress, lungs with wheezes throughout all fields Cardiovascular: Patient is not tachycardic, RRR without murmur appreciated GI: Abd SNT with no guarding or rebound  Back: Normal inspection of the back with good strength and range of motion throughout all ext Extremities: pulses intact with good cap refills, no LE pitting edema or calf tenderness Neuro: The patient is alert and oriented to person, place, and time, appropriately conversive, with 5/5 bilat UE/LE strength, no gross motor or sensory defects noted. Coordination appears to be adequate. Skin: Warm, dry, and intact Psych: normal mood and affect, no SI or HI  ED Results / Procedures / Treatments   Labs (all labs ordered are listed, but only abnormal results are displayed) Labs Reviewed  BASIC METABOLIC PANEL WITH GFR - Abnormal; Notable for the following components:      Result Value   Calcium 8.6 (*)    All other components within normal limits  SARS CORONAVIRUS 2 BY RT PCR  GROUP A STREP BY PCR  CBC  TROPONIN I (HIGH SENSITIVITY)  TROPONIN I (HIGH SENSITIVITY)     EKG EKG and rhythm strip are interpreted by myself:   EKG: [Normal sinus rhythm] at heart rate of 68, normal QRS duration, QTc 393, nonspecific ST segments and T waves no ectopy EKG not consistent with Acute STEMI Rhythm strip: NSR in lead II   RADIOLOGY Xray chest: No acute abnormality on my independent review interpretation radiologist agrees    PROCEDURES:  Critical Care performed: No  Procedures   MEDICATIONS ORDERED IN ED: Medications  ipratropium-albuterol  (DUONEB) 0.5-2.5 (3) MG/3ML nebulizer solution 3 mL (3 mLs Nebulization Given 04/19/24 1720)  ipratropium-albuterol  (DUONEB) 0.5-2.5 (3) MG/3ML nebulizer solution 3 mL (3 mLs Nebulization Given 04/19/24 1719)  predniSONE  (DELTASONE ) tablet 60 mg (60 mg  Oral Given 04/19/24 1718)     IMPRESSION / MDM / ASSESSMENT AND PLAN / ED COURSE           HEART Score: 2                     Differential diagnosis includes, but is not limited to, bronchospasm, pneumonia, PE, atypical ACS, URI, electrolyte derangement   ED course: Patient arrives and he is not tachycardic and is satting well on room air.  PERC score for PE is 0 and I do not think any further testing is necessary.  EKG demonstrated no evidence of acute ischemia and his troponin was less than 3 despite 3 days of symptoms.  He had no profound leukocytosis electrolyte derangements and his COVID and strep test were negative.  Patient does have evidence of bronchospasm on pulmonary exam.  He was given 2 DuoNebs in succession and a dose of prednisone  with good resolve.  He felt improved.  Will send the patient home with scripts for prednisone  and ProAir .  He was counseled on smoking cessation.  Patient feels comfortable returning home and will return if any acutely worsening symptoms   Clinical Course as of 04/19/24 1930  Sat Apr 19, 2024  1715 SARS Coronavirus 2 by RT PCR: NEGATIVE [HD]  1715 Troponin I (High Sensitivity): <2 [HD]  1715 WBC: 10.1 No leukocytosis [HD]  1741 Group A Strep by PCR: NOT DETECTED Negative [HD]    Clinical Course User Index [HD] Nicholaus Rolland BRAVO, MD   At time of discharge there is no evidence of acute life, limb, vision, or fertility threat. Patient has stable vital signs, pain is well controlled, patient is ambulatory and p.o. tolerant.  Discharge instructions were completed using the EPIC system. I would refer you to those at this time. All warnings prescriptions follow-up etc. were discussed in detail with the patient. Patient indicates understanding and is agreeable with this plan. All questions answered.  Patient is made aware that they may return to the emergency department for any worsening or new condition or for any other emergency.  -- Risk: 5 This  patient has a high risk of morbidity due to further diagnostic testing or treatment. Rationale: This patient's evaluation and management involve a high risk of morbidity due to the potential severity of presenting symptoms, need for diagnostic testing, and/or initiation of treatment that may require close monitoring. The differential includes conditions with potential for significant deterioration or requiring escalation of care. Treatment decisions in the ED, including medication administration, procedural interventions, or disposition planning, reflect this level of risk. COPA: 5 The patient has the following acute or chronic illness/injury that poses a possible threat to life or bodily function: [X] :  The patient has a potentially serious acute condition or an acute exacerbation of a chronic illness requiring urgent evaluation and management in the Emergency Department. The clinical presentation necessitates immediate consideration of life-threatening or function-threatening diagnoses, even if they are ultimately ruled out.   FINAL CLINICAL IMPRESSION(S) / ED DIAGNOSES   Final diagnoses:  Bronchospasm  Shortness of breath  Lightheadedness     Rx / DC Orders   ED Discharge Orders          Ordered    predniSONE  (DELTASONE ) 20 MG tablet  Daily with breakfast,   Status:  Discontinued        04/19/24 1745    predniSONE  (DELTASONE ) 20 MG tablet  2 times daily with meals        04/19/24 1746    Ambulatory Referral to Primary Care (Establish Care)        04/19/24 1744    albuterol  (VENTOLIN  HFA) 108 (90 Base) MCG/ACT inhaler  Every 4 hours PRN        04/19/24 1745             Note:  This document was prepared using Dragon voice recognition software and may include unintentional dictation errors.   Nicholaus Rolland BRAVO, MD 04/19/24 [id number]

## 2024-04-19 NOTE — ED Triage Notes (Signed)
 Patient arrives ambulatory by POV c/o dizzy spells and shortness of breath intermittently over past three days. Reports dizziness worse when looking up and sitting up too fast.  Patient speaking in full sentences and in NAD. Reports others sick in household.
